# Patient Record
Sex: Female | Born: 1965 | Race: White | Hispanic: No | Smoking: Current every day smoker
Health system: Southern US, Community
[De-identification: ages and names within clinical notes are randomized; demographics above are authoritative.]

## PROBLEM LIST (undated history)

## (undated) DIAGNOSIS — I10 Essential (primary) hypertension: Secondary | ICD-10-CM

## (undated) DIAGNOSIS — Z720 Tobacco use: Secondary | ICD-10-CM

## (undated) DIAGNOSIS — K219 Gastro-esophageal reflux disease without esophagitis: Secondary | ICD-10-CM

## (undated) DIAGNOSIS — Z9289 Personal history of other medical treatment: Secondary | ICD-10-CM

## (undated) DIAGNOSIS — F419 Anxiety disorder, unspecified: Secondary | ICD-10-CM

## (undated) DIAGNOSIS — O2230 Deep phlebothrombosis in pregnancy, unspecified trimester: Secondary | ICD-10-CM

## (undated) DIAGNOSIS — I2699 Other pulmonary embolism without acute cor pulmonale: Secondary | ICD-10-CM

## (undated) DIAGNOSIS — J42 Unspecified chronic bronchitis: Secondary | ICD-10-CM

## (undated) DIAGNOSIS — F329 Major depressive disorder, single episode, unspecified: Secondary | ICD-10-CM

## (undated) DIAGNOSIS — F32A Depression, unspecified: Secondary | ICD-10-CM

## (undated) DIAGNOSIS — I803 Phlebitis and thrombophlebitis of lower extremities, unspecified: Secondary | ICD-10-CM

## (undated) DIAGNOSIS — G43909 Migraine, unspecified, not intractable, without status migrainosus: Secondary | ICD-10-CM

## (undated) HISTORY — PX: DILATION AND CURETTAGE OF UTERUS: SHX78

## (undated) HISTORY — PX: TUBAL LIGATION: SHX77

## (undated) HISTORY — PX: HERNIA REPAIR: SHX51

## (undated) HISTORY — PX: LAPAROSCOPIC GASTRIC SLEEVE RESECTION: SHX5895

## (undated) HISTORY — PX: LAPAROSCOPIC CHOLECYSTECTOMY: SUR755

## (undated) HISTORY — PX: AUGMENTATION MAMMAPLASTY: SUR837

## (undated) HISTORY — PX: UMBILICAL HERNIA REPAIR: SHX196

---

## 1990-01-03 DIAGNOSIS — I803 Phlebitis and thrombophlebitis of lower extremities, unspecified: Secondary | ICD-10-CM

## 1990-01-03 HISTORY — DX: Phlebitis and thrombophlebitis of lower extremities, unspecified: I80.3

## 2000-03-07 ENCOUNTER — Emergency Department (HOSPITAL_COMMUNITY): Admission: EM | Admit: 2000-03-07 | Discharge: 2000-03-08 | Payer: Self-pay | Admitting: Emergency Medicine

## 2009-07-27 ENCOUNTER — Emergency Department (HOSPITAL_COMMUNITY): Admission: EM | Admit: 2009-07-27 | Discharge: 2009-07-27 | Payer: Self-pay | Admitting: Emergency Medicine

## 2009-07-29 ENCOUNTER — Observation Stay (HOSPITAL_COMMUNITY): Admission: EM | Admit: 2009-07-29 | Discharge: 2009-07-31 | Payer: Self-pay | Admitting: Emergency Medicine

## 2009-07-30 ENCOUNTER — Encounter (INDEPENDENT_AMBULATORY_CARE_PROVIDER_SITE_OTHER): Payer: Self-pay

## 2010-03-20 LAB — POCT I-STAT, CHEM 8
BUN: 12 mg/dL (ref 6–23)
BUN: 13 mg/dL (ref 6–23)
Calcium, Ion: 1.09 mmol/L — ABNORMAL LOW (ref 1.12–1.32)
Calcium, Ion: 1.1 mmol/L — ABNORMAL LOW (ref 1.12–1.32)
Chloride: 101 mEq/L (ref 96–112)
Chloride: 102 mEq/L (ref 96–112)
Creatinine, Ser: 0.8 mg/dL (ref 0.4–1.2)
Glucose, Bld: 100 mg/dL — ABNORMAL HIGH (ref 70–99)
HCT: 43 % (ref 36.0–46.0)
HCT: 43 % (ref 36.0–46.0)
Hemoglobin: 14.6 g/dL (ref 12.0–15.0)
Hemoglobin: 14.6 g/dL (ref 12.0–15.0)
Potassium: 3.7 mEq/L (ref 3.5–5.1)
Potassium: 3.8 mEq/L (ref 3.5–5.1)
TCO2: 27 mmol/L (ref 0–100)
TCO2: 28 mmol/L (ref 0–100)

## 2010-03-20 LAB — CBC
HCT: 38.8 % (ref 36.0–46.0)
Hemoglobin: 13.6 g/dL (ref 12.0–15.0)
MCHC: 35 g/dL (ref 30.0–36.0)
Platelets: 217 10*3/uL (ref 150–400)
RBC: 4.22 MIL/uL (ref 3.87–5.11)
RDW: 13.9 % (ref 11.5–15.5)
RDW: 14.1 % (ref 11.5–15.5)
WBC: 7.4 10*3/uL (ref 4.0–10.5)

## 2010-03-20 LAB — URINALYSIS, ROUTINE W REFLEX MICROSCOPIC
Bilirubin Urine: NEGATIVE
Glucose, UA: NEGATIVE mg/dL
Glucose, UA: NEGATIVE mg/dL
Ketones, ur: NEGATIVE mg/dL
Protein, ur: NEGATIVE mg/dL
Urobilinogen, UA: 0.2 mg/dL (ref 0.0–1.0)

## 2010-03-20 LAB — DIFFERENTIAL
Basophils Absolute: 0 10*3/uL (ref 0.0–0.1)
Basophils Absolute: 0 10*3/uL (ref 0.0–0.1)
Basophils Relative: 0 % (ref 0–1)
Eosinophils Absolute: 0.1 10*3/uL (ref 0.0–0.7)
Eosinophils Absolute: 0.1 10*3/uL (ref 0.0–0.7)
Eosinophils Relative: 1 % (ref 0–5)
Lymphocytes Relative: 32 % (ref 12–46)
Lymphs Abs: 2.3 10*3/uL (ref 0.7–4.0)
Monocytes Absolute: 0.5 10*3/uL (ref 0.1–1.0)
Neutro Abs: 4.4 10*3/uL (ref 1.7–7.7)
Neutrophils Relative %: 60 % (ref 43–77)

## 2010-03-20 LAB — COMPREHENSIVE METABOLIC PANEL
Albumin: 3.9 g/dL (ref 3.5–5.2)
CO2: 30 mEq/L (ref 19–32)
Creatinine, Ser: 1.24 mg/dL — ABNORMAL HIGH (ref 0.4–1.2)
GFR calc non Af Amer: 47 mL/min — ABNORMAL LOW (ref >60)
Glucose, Bld: 91 mg/dL (ref 70–99)
Potassium: 3.4 mEq/L — ABNORMAL LOW (ref 3.5–5.1)
Sodium: 139 mEq/L (ref 135–145)
Total Bilirubin: 0.6 mg/dL (ref 0.3–1.2)

## 2010-03-20 LAB — HEPATIC FUNCTION PANEL
ALT: 18 U/L (ref 0–35)
Albumin: 3.6 g/dL (ref 3.5–5.2)
Alkaline Phosphatase: 52 U/L (ref 39–117)
Indirect Bilirubin: 0.4 mg/dL (ref 0.3–0.9)

## 2010-03-20 LAB — PREGNANCY, URINE: Preg Test, Ur: NEGATIVE

## 2010-03-20 LAB — LIPASE, BLOOD: Lipase: 41 U/L (ref 11–59)

## 2014-07-04 DIAGNOSIS — O2230 Deep phlebothrombosis in pregnancy, unspecified trimester: Secondary | ICD-10-CM

## 2014-07-04 DIAGNOSIS — I2699 Other pulmonary embolism without acute cor pulmonale: Secondary | ICD-10-CM

## 2014-07-04 DIAGNOSIS — Z9289 Personal history of other medical treatment: Secondary | ICD-10-CM

## 2014-07-04 HISTORY — DX: Deep phlebothrombosis in pregnancy, unspecified trimester: O22.30

## 2014-07-04 HISTORY — DX: Other pulmonary embolism without acute cor pulmonale: I26.99

## 2014-07-04 HISTORY — DX: Personal history of other medical treatment: Z92.89

## 2014-07-04 HISTORY — PX: VEIN SURGERY: SHX48

## 2014-07-17 ENCOUNTER — Other Ambulatory Visit: Payer: Self-pay | Admitting: Radiology

## 2014-07-17 DIAGNOSIS — I82401 Acute embolism and thrombosis of unspecified deep veins of right lower extremity: Secondary | ICD-10-CM

## 2014-07-21 ENCOUNTER — Other Ambulatory Visit (HOSPITAL_COMMUNITY): Payer: Self-pay | Admitting: Interventional Radiology

## 2014-07-21 DIAGNOSIS — I82401 Acute embolism and thrombosis of unspecified deep veins of right lower extremity: Secondary | ICD-10-CM

## 2014-07-28 ENCOUNTER — Other Ambulatory Visit (HOSPITAL_COMMUNITY): Payer: Self-pay | Admitting: Interventional Radiology

## 2014-07-28 DIAGNOSIS — I82401 Acute embolism and thrombosis of unspecified deep veins of right lower extremity: Secondary | ICD-10-CM

## 2014-07-29 ENCOUNTER — Other Ambulatory Visit: Payer: Self-pay

## 2014-08-07 ENCOUNTER — Other Ambulatory Visit: Payer: Self-pay

## 2014-09-03 ENCOUNTER — Other Ambulatory Visit: Payer: Self-pay

## 2014-12-13 ENCOUNTER — Encounter (HOSPITAL_BASED_OUTPATIENT_CLINIC_OR_DEPARTMENT_OTHER): Payer: Self-pay | Admitting: *Deleted

## 2014-12-13 ENCOUNTER — Emergency Department (HOSPITAL_BASED_OUTPATIENT_CLINIC_OR_DEPARTMENT_OTHER)
Admission: EM | Admit: 2014-12-13 | Discharge: 2014-12-13 | Disposition: A | Discharge status: Home / Self Care | Payer: 59 | Attending: Emergency Medicine | Admitting: Emergency Medicine

## 2014-12-13 ENCOUNTER — Emergency Department (HOSPITAL_BASED_OUTPATIENT_CLINIC_OR_DEPARTMENT_OTHER): Payer: 59

## 2014-12-13 DIAGNOSIS — I10 Essential (primary) hypertension: Secondary | ICD-10-CM | POA: Diagnosis not present

## 2014-12-13 DIAGNOSIS — S6992XA Unspecified injury of left wrist, hand and finger(s), initial encounter: Secondary | ICD-10-CM | POA: Diagnosis present

## 2014-12-13 DIAGNOSIS — S60212A Contusion of left wrist, initial encounter: Secondary | ICD-10-CM | POA: Diagnosis not present

## 2014-12-13 DIAGNOSIS — Y9389 Activity, other specified: Secondary | ICD-10-CM | POA: Insufficient documentation

## 2014-12-13 DIAGNOSIS — S8001XA Contusion of right knee, initial encounter: Secondary | ICD-10-CM | POA: Diagnosis not present

## 2014-12-13 DIAGNOSIS — Y998 Other external cause status: Secondary | ICD-10-CM | POA: Diagnosis not present

## 2014-12-13 DIAGNOSIS — Z86718 Personal history of other venous thrombosis and embolism: Secondary | ICD-10-CM | POA: Insufficient documentation

## 2014-12-13 DIAGNOSIS — Z86711 Personal history of pulmonary embolism: Secondary | ICD-10-CM | POA: Insufficient documentation

## 2014-12-13 DIAGNOSIS — W108XXA Fall (on) (from) other stairs and steps, initial encounter: Secondary | ICD-10-CM | POA: Insufficient documentation

## 2014-12-13 DIAGNOSIS — Y9289 Other specified places as the place of occurrence of the external cause: Secondary | ICD-10-CM | POA: Diagnosis not present

## 2014-12-13 DIAGNOSIS — Z7901 Long term (current) use of anticoagulants: Secondary | ICD-10-CM | POA: Diagnosis not present

## 2014-12-13 DIAGNOSIS — Z791 Long term (current) use of non-steroidal anti-inflammatories (NSAID): Secondary | ICD-10-CM | POA: Insufficient documentation

## 2014-12-13 DIAGNOSIS — F17210 Nicotine dependence, cigarettes, uncomplicated: Secondary | ICD-10-CM | POA: Diagnosis not present

## 2014-12-13 DIAGNOSIS — S60812A Abrasion of left wrist, initial encounter: Secondary | ICD-10-CM | POA: Diagnosis not present

## 2014-12-13 HISTORY — DX: Essential (primary) hypertension: I10

## 2014-12-13 HISTORY — DX: Other pulmonary embolism without acute cor pulmonale: I26.99

## 2014-12-13 HISTORY — DX: Deep phlebothrombosis in pregnancy, unspecified trimester: O22.30

## 2014-12-13 MED ORDER — HYDROCODONE-ACETAMINOPHEN 5-325 MG PO TABS
2.0000 | ORAL_TABLET | Freq: Once | ORAL | Status: AC
  Administered 2014-12-13: 2 via ORAL
  Filled 2014-12-13: qty 2

## 2014-12-13 MED ORDER — HYDROCODONE-ACETAMINOPHEN 5-325 MG PO TABS: 1.0000 | ORAL_TABLET | Freq: Four times a day (QID) | ORAL | Status: AC | PRN

## 2014-12-13 NOTE — ED Notes (Deleted)
Pt reports flank pain since 1600. States she thought she had a UTI earlier in the week but sx had resolved until today

## 2014-12-13 NOTE — ED Notes (Signed)
Pt reports she fell down 3 steps this morning- c/o pain in left wrist and right knee- states pain is worse in wrist and has been walking without difficulty

## 2014-12-13 NOTE — ED Notes (Signed)
Patient stable and ambulatory.  Patient verbalizes understanding of discharge medications, instructions and follow-up. 

## 2014-12-13 NOTE — Discharge Instructions (Signed)
1. Medications: vicodin, alternate naprosyn and tylenol for pain control, usual home medications 2. Treatment: rest, ice, elevate and use brace, drink plenty of fluids, gentle stretching 3. Follow Up: Please followup with orthopedics as directed or your PCP in 1 week if no improvement for discussion of your diagnoses and further evaluation after today's visit; if you do not have a primary care doctor use the resource guide provided to find one; Please return to the ER for worsening symptoms or other concerns    Contusion A contusion is a deep bruise. Contusions are the result of a blunt injury to tissues and muscle fibers under the skin. The injury causes bleeding under the skin. The skin overlying the contusion may turn blue, purple, or yellow. Minor injuries will give you a painless contusion, but more severe contusions may stay painful and swollen for a few weeks.  CAUSES  This condition is usually caused by a blow, trauma, or direct force to an area of the body. SYMPTOMS  Symptoms of this condition include:  Swelling of the injured area.  Pain and tenderness in the injured area.  Discoloration. The area may have redness and then turn blue, purple, or yellow. DIAGNOSIS  This condition is diagnosed based on a physical exam and medical history. An X-ray, CT scan, or MRI may be needed to determine if there are any associated injuries, such as broken bones (fractures). TREATMENT  Specific treatment for this condition depends on what area of the body was injured. In general, the best treatment for a contusion is resting, icing, applying pressure to (compression), and elevating the injured area. This is often called the RICE strategy. Over-the-counter anti-inflammatory medicines may also be recommended for pain control.  HOME CARE INSTRUCTIONS   Rest the injured area.  If directed, apply ice to the injured area:  Put ice in a plastic bag.  Place a towel between your skin and the  bag.  Leave the ice on for 20 minutes, 2-3 times per day.  If directed, apply light compression to the injured area using an elastic bandage. Make sure the bandage is not wrapped too tightly. Remove and reapply the bandage as directed by your health care provider.  If possible, raise (elevate) the injured area above the level of your heart while you are sitting or lying down.  Take over-the-counter and prescription medicines only as told by your health care provider. SEEK MEDICAL CARE IF:  Your symptoms do not improve after several days of treatment.  Your symptoms get worse.  You have difficulty moving the injured area. SEEK IMMEDIATE MEDICAL CARE IF:   You have severe pain.  You have numbness in a hand or foot.  Your hand or foot turns pale or cold.   This information is not intended to replace advice given to you by your health care provider. Make sure you discuss any questions you have with your health care provider.   Document Released: 09/29/2004 Document Revised: 09/10/2014 Document Reviewed: 05/07/2014 Elsevier Interactive Patient Education 2016 [place].    Cryotherapy Cryotherapy means treatment with cold. Ice or gel packs can be used to reduce both pain and swelling. Ice is the most helpful within the first 24 to 48 hours after an injury or flare-up from overusing a muscle or joint. Sprains, strains, spasms, burning pain, shooting pain, and aches can all be eased with ice. Ice can also be used when recovering from surgery. Ice is effective, has very few side effects, and is safe for  most people to use. PRECAUTIONS  Ice is not a safe treatment option for people with:  Raynaud phenomenon. This is a condition affecting small blood vessels in the extremities. Exposure to cold may cause your problems to return.  Cold hypersensitivity. There are many forms of cold hypersensitivity, including:  Cold urticaria. Red, itchy hives appear on the skin when the tissues begin  to warm after being iced.  Cold erythema. This is a red, itchy rash caused by exposure to cold.  Cold hemoglobinuria. Red blood cells break down when the tissues begin to warm after being iced. The hemoglobin that carry oxygen are passed into the urine because they cannot combine with blood proteins fast enough.  Numbness or altered sensitivity in the area being iced. If you have any of the following conditions, do not use ice until you have discussed cryotherapy with your caregiver:  Heart conditions, such as arrhythmia, angina, or chronic heart disease.  High blood pressure.  Healing wounds or open skin in the area being iced.  Current infections.  Rheumatoid arthritis.  Poor circulation.  Diabetes. Ice slows the blood flow in the region it is applied. This is beneficial when trying to stop inflamed tissues from spreading irritating chemicals to surrounding tissues. However, if you expose your skin to cold temperatures for too long or without the proper protection, you can damage your skin or nerves. Watch for signs of skin damage due to cold. HOME CARE INSTRUCTIONS Follow these tips to use ice and cold packs safely.  Place a dry or damp towel between the ice and skin. A damp towel will cool the skin more quickly, so you may need to shorten the time that the ice is used.  For a more rapid response, add gentle compression to the ice.  Ice for no more than 10 to 20 minutes at a time. The bonier the area you are icing, the less time it will take to get the benefits of ice.  Check your skin after 5 minutes to make sure there are no signs of a poor response to cold or skin damage.  Rest 20 minutes or more between uses.  Once your skin is numb, you can end your treatment. You can test numbness by very lightly touching your skin. The touch should be so light that you do not see the skin dimple from the pressure of your fingertip. When using ice, most people will feel these normal  sensations in this order: cold, burning, aching, and numbness.  Do not use ice on someone who cannot communicate their responses to pain, such as small children or people with dementia. HOW TO MAKE AN ICE PACK Ice packs are the most common way to use ice therapy. Other methods include ice massage, ice baths, and cryosprays. Muscle creams that cause a cold, tingly feeling do not offer the same benefits that ice offers and should not be used as a substitute unless recommended by your caregiver. To make an ice pack, do one of the following:  Place crushed ice or a bag of frozen vegetables in a sealable plastic bag. Squeeze out the excess air. Place this bag inside another plastic bag. Slide the bag into a pillowcase or place a damp towel between your skin and the bag.  Mix 3 parts water with 1 part rubbing alcohol. Freeze the mixture in a sealable plastic bag. When you remove the mixture from the freezer, it will be slushy. Squeeze out the excess air. Place this bag inside  another plastic bag. Slide the bag into a pillowcase or place a damp towel between your skin and the bag. SEEK MEDICAL CARE IF:  You develop white spots on your skin. This may give the skin a blotchy (mottled) appearance.  Your skin turns blue or pale.  Your skin becomes waxy or hard.  Your swelling gets worse. MAKE SURE YOU:   Understand these instructions.  Will watch your condition.  Will get help right away if you are not doing well or get worse.   This information is not intended to replace advice given to you by your health care provider. Make sure you discuss any questions you have with your health care provider.   Document Released: 08/16/2010 Document Revised: 01/10/2014 Document Reviewed: 08/16/2010 Elsevier Interactive Patient Education [employer].

## 2014-12-13 NOTE — ED Provider Notes (Signed)
CSN: [id number]     Arrival date & time 12/13/14  1744 History   First MD Initiated Contact with Patient 12/13/14 1817     Chief Complaint  Patient presents with  . Wrist Pain     (Consider location/radiation/quality/duration/timing/severity/associated sxs/prior Treatment) The history is provided by the patient and medical records. No language interpreter was used.   Sonya Arnold is a 49 y.o. 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 Patient is adamant that she did not hit her head or have a loss of consciousness. She reports her pain is localized to her left wrist and right knee.  Patient reports she has been ambulatory without difficulty. She denies numbness, tingling, weakness. She denies neck or back pain. She has associated swelling and ecchymosis to the left wrist without laceration. She has taken ibuprofen without improvement. Palpation and movement makes it worse.  Past Medical History  Diagnosis Date  . DVT (deep vein thrombosis) in pregnancy   . PE (pulmonary embolism)   . Hypertension    Past Surgical History  Procedure Laterality Date  . Cholecystectomy    . Tubal ligation    . Vein surgery     No family history on file. Social History  Substance Use Topics  . Smoking status: Current Every Day Smoker    Types: Cigarettes  . Smokeless tobacco: Never Used  . Alcohol Use: No   OB History    No data available     Review of Systems  Constitutional: Negative for fever and chills.  Gastrointestinal: Negative for nausea and vomiting.  Musculoskeletal: Positive for joint swelling and arthralgias. Negative for back pain, neck pain and neck stiffness.  Skin: Negative for wound.  Neurological: Negative for numbness.  Hematological: Does not bruise/bleed  easily.  Psychiatric/Behavioral: The patient is not nervous/anxious.   All other systems reviewed and are negative.     Allergies  Cataflam and Norvasc  Home Medications   Prior to Admission medications   Medication Sig Start Date End Date Taking? Authorizing Provider  meloxicam (MOBIC) 7.5 MG tablet Take 7.5 mg by mouth daily.   Yes Historical Provider, MD  RANITIDINE HCL PO Take by mouth.   Yes Historical Provider, MD  Rivaroxaban (XARELTO) 15 MG TABS tablet Take 15 mg by mouth daily with supper.   Yes Historical Provider, MD  HYDROcodone-acetaminophen (NORCO/VICODIN) 5-325 MG tablet Take 1-2 tablets by mouth every 6 (six) hours as needed for moderate pain or severe pain. 12/13/14   Crysta Gulick, PA-C   BP 154/89 mmHg  Pulse 84  Temp(Src) 98.1 F (36.7 C) (Oral)  Resp 20  Ht 5\' 2"  (1.575 m)  Wt 81.647 kg  BMI 32.91 kg/m2  SpO2 99%  LMP 11/29/2014 (Exact Date) Physical Exam  Constitutional: She appears well-developed and well-nourished. No distress.  HENT:  Head: Normocephalic and atraumatic.  Eyes: Conjunctivae are normal.  Neck: Normal range of motion.  Cardiovascular: Normal rate, regular rhythm and intact distal pulses.   Capillary refill < 3 sec  Pulmonary/Chest: Effort normal and breath sounds normal.  Musculoskeletal: She exhibits tenderness. She exhibits no edema.  ROM: Full range of motion of the left shoulder, left elbow and all fingers of the left hand including the thumb Slightly decreased range of motion of the left wrist Contusion, abrasion and ecchymosis noted  to the lateral portion of the left wrist extending onto the dorsum of the hand with tenderness to palpation in this area No palpable deformity No laceration Full range of motion of the right knee with small area of ecchymosis just inferior to the patella Abnormal patellar movement, no joint line tenderness of the right knee  Neurological: She is alert. Coordination normal.  Sensation intact  to dull and sharp Strength 5/5 in the left hand including strong grip strength; 4/5 in the left wrist due to pain  Skin: Skin is warm and dry. She is not diaphoretic.  No tenting of the skin  Psychiatric: She has a normal mood and affect.  Nursing note and vitals reviewed.   ED Course  Procedures (including critical care time)  Imaging Review Dg Wrist Complete Left  12/13/2014  CLINICAL DATA:  Fall down stairs with left wrist pain, initial encounter EXAM: LEFT WRIST - COMPLETE 3+ VIEW COMPARISON:  None. FINDINGS: There is no evidence of fracture or dislocation. There is no evidence of arthropathy or other focal bone abnormality. Soft tissues are unremarkable. IMPRESSION: No acute abnormality noted. Electronically Signed   By: Inez Catalina M.D.   On: 12/13/2014 18:51   Dg Hand Complete Left  12/13/2014  CLINICAL DATA:  Acute left hand pain after fall down steps this morning. Initial encounter. EXAM: LEFT HAND - COMPLETE 3+ VIEW COMPARISON:  None. FINDINGS: There is no evidence of fracture or dislocation. There is no evidence of arthropathy or other focal bone abnormality. Soft tissues are unremarkable. IMPRESSION: Normal left hand. Electronically Signed   By: Marijo Conception, M.D.   On: 12/13/2014 18:52   I have personally reviewed and evaluated these images and lab results as part of my medical decision-making.    MDM   Final diagnoses:  Left wrist injury, initial encounter  Wrist contusion, left, initial encounter  Knee contusion, right, initial encounter   Sonya Arnold presents with left wrist pain, ecchymosis and swelling.  Patient X-Ray negative for obvious fracture or dislocation. Pain managed in ED. Pt advised to follow up with [facility] care and for orthopedics for further evaluation if symptoms persist. Patient given brace while in ED, conservative therapy recommended and discussed. Patient will be dc home & is agreeable with above plan.   Abigail Butts, PA-C 12/13/14  [place], MD 12/14/14 [phone]

## 2014-12-31 ENCOUNTER — Emergency Department (HOSPITAL_BASED_OUTPATIENT_CLINIC_OR_DEPARTMENT_OTHER): Payer: 59

## 2014-12-31 ENCOUNTER — Inpatient Hospital Stay (HOSPITAL_BASED_OUTPATIENT_CLINIC_OR_DEPARTMENT_OTHER)
Admission: EM | Admit: 2014-12-31 | Discharge: 2015-01-03 | DRG: 203 | Disposition: A | Discharge status: Home / Self Care | Payer: 59 | Attending: Internal Medicine | Admitting: Internal Medicine

## 2014-12-31 ENCOUNTER — Encounter (HOSPITAL_BASED_OUTPATIENT_CLINIC_OR_DEPARTMENT_OTHER): Payer: Self-pay | Admitting: *Deleted

## 2014-12-31 DIAGNOSIS — Z720 Tobacco use: Secondary | ICD-10-CM | POA: Diagnosis present

## 2014-12-31 DIAGNOSIS — I10 Essential (primary) hypertension: Secondary | ICD-10-CM | POA: Diagnosis present

## 2014-12-31 DIAGNOSIS — N920 Excessive and frequent menstruation with regular cycle: Secondary | ICD-10-CM | POA: Diagnosis present

## 2014-12-31 DIAGNOSIS — D500 Iron deficiency anemia secondary to blood loss (chronic): Secondary | ICD-10-CM | POA: Diagnosis present

## 2014-12-31 DIAGNOSIS — T380X5A Adverse effect of glucocorticoids and synthetic analogues, initial encounter: Secondary | ICD-10-CM | POA: Diagnosis not present

## 2014-12-31 DIAGNOSIS — Z791 Long term (current) use of non-steroidal anti-inflammatories (NSAID): Secondary | ICD-10-CM | POA: Diagnosis not present

## 2014-12-31 DIAGNOSIS — O2230 Deep phlebothrombosis in pregnancy, unspecified trimester: Secondary | ICD-10-CM

## 2014-12-31 DIAGNOSIS — R05 Cough: Secondary | ICD-10-CM | POA: Diagnosis present

## 2014-12-31 DIAGNOSIS — J40 Bronchitis, not specified as acute or chronic: Secondary | ICD-10-CM | POA: Diagnosis present

## 2014-12-31 DIAGNOSIS — Z7902 Long term (current) use of antithrombotics/antiplatelets: Secondary | ICD-10-CM

## 2014-12-31 DIAGNOSIS — J209 Acute bronchitis, unspecified: Secondary | ICD-10-CM | POA: Diagnosis present

## 2014-12-31 DIAGNOSIS — G43909 Migraine, unspecified, not intractable, without status migrainosus: Secondary | ICD-10-CM | POA: Diagnosis present

## 2014-12-31 DIAGNOSIS — Z716 Tobacco abuse counseling: Secondary | ICD-10-CM

## 2014-12-31 DIAGNOSIS — K219 Gastro-esophageal reflux disease without esophagitis: Secondary | ICD-10-CM | POA: Diagnosis present

## 2014-12-31 DIAGNOSIS — D72828 Other elevated white blood cell count: Secondary | ICD-10-CM | POA: Diagnosis not present

## 2014-12-31 DIAGNOSIS — D509 Iron deficiency anemia, unspecified: Secondary | ICD-10-CM | POA: Diagnosis present

## 2014-12-31 DIAGNOSIS — Z86718 Personal history of other venous thrombosis and embolism: Secondary | ICD-10-CM | POA: Diagnosis not present

## 2014-12-31 DIAGNOSIS — F329 Major depressive disorder, single episode, unspecified: Secondary | ICD-10-CM | POA: Diagnosis present

## 2014-12-31 DIAGNOSIS — F419 Anxiety disorder, unspecified: Secondary | ICD-10-CM | POA: Diagnosis present

## 2014-12-31 DIAGNOSIS — Y92239 Unspecified place in hospital as the place of occurrence of the external cause: Secondary | ICD-10-CM | POA: Diagnosis not present

## 2014-12-31 DIAGNOSIS — Z9049 Acquired absence of other specified parts of digestive tract: Secondary | ICD-10-CM

## 2014-12-31 DIAGNOSIS — Z86711 Personal history of pulmonary embolism: Secondary | ICD-10-CM

## 2014-12-31 DIAGNOSIS — I82409 Acute embolism and thrombosis of unspecified deep veins of unspecified lower extremity: Secondary | ICD-10-CM | POA: Diagnosis present

## 2014-12-31 DIAGNOSIS — D251 Intramural leiomyoma of uterus: Secondary | ICD-10-CM | POA: Diagnosis present

## 2014-12-31 DIAGNOSIS — F17210 Nicotine dependence, cigarettes, uncomplicated: Secondary | ICD-10-CM | POA: Diagnosis present

## 2014-12-31 DIAGNOSIS — D649 Anemia, unspecified: Secondary | ICD-10-CM

## 2014-12-31 DIAGNOSIS — J42 Unspecified chronic bronchitis: Secondary | ICD-10-CM | POA: Diagnosis present

## 2014-12-31 DIAGNOSIS — R059 Cough, unspecified: Secondary | ICD-10-CM | POA: Diagnosis present

## 2014-12-31 DIAGNOSIS — Z8249 Family history of ischemic heart disease and other diseases of the circulatory system: Secondary | ICD-10-CM

## 2014-12-31 DIAGNOSIS — I2699 Other pulmonary embolism without acute cor pulmonale: Secondary | ICD-10-CM | POA: Diagnosis not present

## 2014-12-31 DIAGNOSIS — N83292 Other ovarian cyst, left side: Secondary | ICD-10-CM | POA: Diagnosis present

## 2014-12-31 HISTORY — DX: Migraine, unspecified, not intractable, without status migrainosus: G43.909

## 2014-12-31 HISTORY — DX: Depression, unspecified: F32.A

## 2014-12-31 HISTORY — DX: Unspecified chronic bronchitis: J42

## 2014-12-31 HISTORY — DX: Gastro-esophageal reflux disease without esophagitis: K21.9

## 2014-12-31 HISTORY — DX: Personal history of other medical treatment: Z92.89

## 2014-12-31 HISTORY — DX: Major depressive disorder, single episode, unspecified: F32.9

## 2014-12-31 HISTORY — DX: Phlebitis and thrombophlebitis of lower extremities, unspecified: I80.3

## 2014-12-31 HISTORY — DX: Tobacco use: Z72.0

## 2014-12-31 HISTORY — DX: Anxiety disorder, unspecified: F41.9

## 2014-12-31 LAB — PROTIME-INR
INR: 1.43 (ref 0.00–1.49)
PROTHROMBIN TIME: 17.5 s — AB (ref 11.6–15.2)

## 2014-12-31 LAB — CBC WITH DIFFERENTIAL/PLATELET
BASOS ABS: 0 10*3/uL (ref 0.0–0.1)
Basophils Relative: 1 %
Eosinophils Absolute: 0.1 10*3/uL (ref 0.0–0.7)
Eosinophils Relative: 2 %
HEMATOCRIT: 27.4 % — AB (ref 36.0–46.0)
HEMOGLOBIN: 7.7 g/dL — AB (ref 12.0–15.0)
LYMPHS PCT: 18 %
Lymphs Abs: 1.3 10*3/uL (ref 0.7–4.0)
MCH: 19.4 pg — ABNORMAL LOW (ref 26.0–34.0)
MCHC: 28.1 g/dL — AB (ref 30.0–36.0)
MCV: 69.2 fL — ABNORMAL LOW (ref 78.0–100.0)
MONO ABS: 0.7 10*3/uL (ref 0.1–1.0)
Monocytes Relative: 10 %
NEUTROS ABS: 5.3 10*3/uL (ref 1.7–7.7)
Neutrophils Relative %: 71 %
Platelets: 272 10*3/uL (ref 150–400)
RBC: 3.96 MIL/uL (ref 3.87–5.11)
RDW: 18 % — ABNORMAL HIGH (ref 11.5–15.5)
WBC: 7.5 10*3/uL (ref 4.0–10.5)

## 2014-12-31 LAB — BASIC METABOLIC PANEL
ANION GAP: 5 (ref 5–15)
BUN: 12 mg/dL (ref 6–20)
CHLORIDE: 108 mmol/L (ref 101–111)
CO2: 26 mmol/L (ref 22–32)
Calcium: 8.4 mg/dL — ABNORMAL LOW (ref 8.9–10.3)
Creatinine, Ser: 0.82 mg/dL (ref 0.44–1.00)
GFR calc Af Amer: >60 mL/min (ref >60)
GLUCOSE: 94 mg/dL (ref 65–99)
POTASSIUM: 3.8 mmol/L (ref 3.5–5.1)
Sodium: 139 mmol/L (ref 135–145)

## 2014-12-31 LAB — URINALYSIS, ROUTINE W REFLEX MICROSCOPIC
Glucose, UA: NEGATIVE mg/dL
Hgb urine dipstick: NEGATIVE
Ketones, ur: 15 mg/dL — AB
LEUKOCYTES UA: NEGATIVE
NITRITE: NEGATIVE
PH: 6 (ref 5.0–8.0)
Protein, ur: 30 mg/dL — AB
SPECIFIC GRAVITY, URINE: 1.027 (ref 1.005–1.030)

## 2014-12-31 LAB — URINE MICROSCOPIC-ADD ON: RBC / HPF: NONE SEEN RBC/hpf (ref 0–5)

## 2014-12-31 LAB — OCCULT BLOOD X 1 CARD TO LAB, STOOL: Fecal Occult Bld: NEGATIVE

## 2014-12-31 LAB — APTT: aPTT: 39 seconds — ABNORMAL HIGH (ref 24–37)

## 2014-12-31 MED ORDER — NICOTINE 21 MG/24HR TD PT24
21.0000 mg | MEDICATED_PATCH | Freq: Every day | TRANSDERMAL | Status: DC
  Administered 2015-01-01 – 2015-01-03 (×3): 21 mg via TRANSDERMAL
  Filled 2014-12-31 (×3): qty 1

## 2014-12-31 MED ORDER — IPRATROPIUM-ALBUTEROL 0.5-2.5 (3) MG/3ML IN SOLN
3.0000 mL | RESPIRATORY_TRACT | Status: DC
  Administered 2015-01-01: 3 mL via RESPIRATORY_TRACT
  Filled 2014-12-31: qty 3

## 2014-12-31 MED ORDER — DEXAMETHASONE SODIUM PHOSPHATE 10 MG/ML IJ SOLN
10.0000 mg | Freq: Once | INTRAMUSCULAR | Status: AC
  Administered 2014-12-31: 10 mg via INTRAVENOUS
  Filled 2014-12-31: qty 1

## 2014-12-31 MED ORDER — ONDANSETRON HCL 4 MG/2ML IJ SOLN
4.0000 mg | Freq: Four times a day (QID) | INTRAMUSCULAR | Status: DC | PRN
Start: 2014-12-31 — End: 2015-01-03

## 2014-12-31 MED ORDER — ONDANSETRON HCL 4 MG PO TABS: 4.0000 mg | ORAL_TABLET | Freq: Four times a day (QID) | ORAL | Status: DC | PRN

## 2014-12-31 MED ORDER — RIVAROXABAN 15 MG PO TABS: 15.0000 mg | ORAL_TABLET | Freq: Every day | ORAL | Status: DC

## 2014-12-31 MED ORDER — TRAZODONE HCL 100 MG PO TABS
100.0000 mg | ORAL_TABLET | Freq: Every day | ORAL | Status: DC
  Administered 2015-01-01 – 2015-01-02 (×3): 100 mg via ORAL
  Filled 2014-12-31 (×3): qty 1

## 2014-12-31 MED ORDER — METHYLPREDNISOLONE SODIUM SUCC 125 MG IJ SOLR
60.0000 mg | Freq: Every day | INTRAMUSCULAR | Status: DC
  Administered 2015-01-01 – 2015-01-02 (×2): 60 mg via INTRAVENOUS
  Filled 2014-12-31 (×2): qty 2

## 2014-12-31 MED ORDER — DIAZEPAM 5 MG PO TABS
10.0000 mg | ORAL_TABLET | Freq: Two times a day (BID) | ORAL | Status: DC | PRN
  Administered 2015-01-01 – 2015-01-03 (×5): 10 mg via ORAL
  Filled 2014-12-31 (×5): qty 2

## 2014-12-31 MED ORDER — HYDROCODONE-ACETAMINOPHEN 5-325 MG PO TABS
1.0000 | ORAL_TABLET | Freq: Four times a day (QID) | ORAL | Status: DC | PRN
  Administered 2015-01-01 – 2015-01-03 (×9): 2 via ORAL
  Filled 2014-12-31 (×9): qty 2

## 2014-12-31 MED ORDER — IPRATROPIUM-ALBUTEROL 0.5-2.5 (3) MG/3ML IN SOLN
3.0000 mL | Freq: Once | RESPIRATORY_TRACT | Status: AC
Start: 2014-12-31 — End: 2014-12-31
  Administered 2014-12-31: 3 mL via RESPIRATORY_TRACT
  Filled 2014-12-31: qty 3

## 2014-12-31 MED ORDER — ACETAMINOPHEN 325 MG PO TABS: 650.0000 mg | ORAL_TABLET | Freq: Four times a day (QID) | ORAL | Status: DC | PRN

## 2014-12-31 MED ORDER — SODIUM CHLORIDE 0.9 % IJ SOLN
3.0000 mL | Freq: Two times a day (BID) | INTRAMUSCULAR | Status: DC
  Administered 2015-01-01 – 2015-01-02 (×4): 3 mL via INTRAVENOUS

## 2014-12-31 MED ORDER — ALBUTEROL SULFATE (2.5 MG/3ML) 0.083% IN NEBU: 5.0000 mg | INHALATION_SOLUTION | RESPIRATORY_TRACT | Status: AC | PRN

## 2014-12-31 MED ORDER — ACETAMINOPHEN 650 MG RE SUPP: 650.0000 mg | Freq: Four times a day (QID) | RECTAL | Status: DC | PRN

## 2014-12-31 MED ORDER — AZITHROMYCIN 500 MG PO TABS
500.0000 mg | ORAL_TABLET | Freq: Every day | ORAL | Status: AC
  Administered 2015-01-01: 500 mg via ORAL
  Filled 2014-12-31: qty 1

## 2014-12-31 MED ORDER — DM-GUAIFENESIN ER 30-600 MG PO TB12
1.0000 | ORAL_TABLET | Freq: Two times a day (BID) | ORAL | Status: DC
  Administered 2015-01-01 – 2015-01-03 (×6): 1 via ORAL
  Filled 2014-12-31 (×6): qty 1

## 2014-12-31 MED ORDER — AZITHROMYCIN 500 MG PO TABS
250.0000 mg | ORAL_TABLET | Freq: Every day | ORAL | Status: DC
  Administered 2015-01-01 – 2015-01-03 (×3): 250 mg via ORAL
  Filled 2014-12-31 (×3): qty 1

## 2014-12-31 MED ORDER — ALBUTEROL SULFATE (2.5 MG/3ML) 0.083% IN NEBU: 5.0000 mg | INHALATION_SOLUTION | RESPIRATORY_TRACT | Status: DC | PRN

## 2014-12-31 MED ORDER — IPRATROPIUM-ALBUTEROL 0.5-2.5 (3) MG/3ML IN SOLN
3.0000 mL | Freq: Once | RESPIRATORY_TRACT | Status: AC
  Administered 2014-12-31: 3 mL via RESPIRATORY_TRACT
  Filled 2014-12-31: qty 3

## 2014-12-31 MED ORDER — MELOXICAM 7.5 MG PO TABS
7.5000 mg | ORAL_TABLET | Freq: Every day | ORAL | Status: DC
  Administered 2015-01-01 – 2015-01-03 (×3): 7.5 mg via ORAL
  Filled 2014-12-31 (×4): qty 1

## 2014-12-31 MED ORDER — FAMOTIDINE 20 MG PO TABS
10.0000 mg | ORAL_TABLET | Freq: Two times a day (BID) | ORAL | Status: DC
  Administered 2015-01-01 – 2015-01-03 (×6): 10 mg via ORAL
  Filled 2014-12-31 (×6): qty 1

## 2014-12-31 NOTE — ED Notes (Signed)
Pt amb to triage with quick steady gait in nad. Pt reports cough and congestion x Saturday.

## 2014-12-31 NOTE — Progress Notes (Signed)
Transfer from  [facility]:  49 year old lady with past medical history of hypertension, DVT, PE on Xarelto, who presents with cough, congestion and fever for 5 days. Patient has wheezing per EDP. Chest x-ray is negative for pneumonia.   Patient was found to have hemoglobin drop from 9.9 in July to 7.7 today per EDP. Patient had normal period. No hematochezia. Electrolytes and renal function okay, WBC 7.5, temperature normal in the emergency room, heart rate 97, RR 20, oxygen saturation 96%. Pt is accepted to tele bed. FOBT will be done by EDP.   Ivor Costa, MD  [facility] Pager [phone]  If 7PM-7AM, please contact night-coverage [email or web address] Password [facility] 12/31/2014, 8:03 PM

## 2014-12-31 NOTE — ED Provider Notes (Signed)
CSN: [id number]     Arrival date & time 12/31/14  1542 History   First MD Initiated Contact with Patient 12/31/14 1703     Chief Complaint  Patient presents with  . Cough     (Consider location/radiation/quality/duration/timing/severity/associated sxs/prior Treatment) The history is provided by the patient.     Pt with hx DVT, PE, on xarelto, chronic smoker p/w 5 days of dry cough, SOB, chest soreness, fevers to 102, myalgias, sore throat.  Father has been admitted for pneumonia.  Has been taking mucinex and OTC cough medication without improvement.    Has hx PE and DVT and is on xarelto.  Has been having dyspnea with exertion, worsening over some time but much worse since she became ill last week.  Does not follow with anyone to check blood counts.  Has not noticed any abnormal bleeding.  LMP last week, normal.    Past Medical History  Diagnosis Date  . DVT (deep vein thrombosis) in pregnancy   . PE (pulmonary embolism)   . Hypertension    Past Surgical History  Procedure Laterality Date  . Cholecystectomy    . Tubal ligation    . Vein surgery     History reviewed. No pertinent family history. Social History  Substance Use Topics  . Smoking status: Current Every Day Smoker    Types: Cigarettes  . Smokeless tobacco: Never Used  . Alcohol Use: No   OB History    No data available     Review of Systems  All other systems reviewed and are negative.     Allergies  Cataflam and Norvasc  Home Medications   Prior to Admission medications   Medication Sig Start Date End Date Taking? Authorizing Provider  diazepam (VALIUM) 5 MG tablet Take 10 mg by mouth every 12 (twelve) hours as needed for anxiety.   Yes Historical Provider, MD  traZODone (DESYREL) 100 MG tablet Take 100 mg by mouth at bedtime.   Yes Historical Provider, MD  HYDROcodone-acetaminophen (NORCO/VICODIN) 5-325 MG tablet Take 1-2 tablets by mouth every 6 (six) hours as needed for moderate pain or severe  pain. 12/13/14   Hannah Muthersbaugh, PA-C  meloxicam (MOBIC) 7.5 MG tablet Take 7.5 mg by mouth daily.    Historical Provider, MD  RANITIDINE HCL PO Take by mouth.    Historical Provider, MD  Rivaroxaban (XARELTO) 15 MG TABS tablet Take 15 mg by mouth daily with supper.    Historical Provider, MD   BP 119/71 mmHg  Pulse 93  Temp(Src) 98.8 F (37.1 C) (Oral)  Resp 20  Ht 5\' 2"  (1.575 m)  Wt 79.379 kg  BMI 32.00 kg/m2  SpO2 97%  LMP 12/29/2014 Physical Exam  Constitutional: She appears well-developed and well-nourished. No distress.  Uncomfortable appearing  HENT:  Head: Normocephalic and atraumatic.  Eyes: Conjunctivae are normal.  Neck: Normal range of motion. Neck supple.  Cardiovascular: Normal rate and regular rhythm.   Pulmonary/Chest: Effort normal. No respiratory distress. She has decreased breath sounds. She has wheezes. She has no rhonchi. She has rales.  Increased work of breathing  Musculoskeletal: She exhibits no edema.  Neurological: She is alert. She exhibits normal muscle tone.  Skin: She is not diaphoretic.  Psychiatric: She has a normal mood and affect. Her behavior is normal.  Nursing note and vitals reviewed.   ED Course  Procedures (including critical care time) Labs Review Labs Reviewed  BASIC METABOLIC PANEL - Abnormal; Notable for the following:    Calcium  8.4 (*)    All other components within normal limits  CBC WITH DIFFERENTIAL/PLATELET - Abnormal; Notable for the following:    Hemoglobin 7.7 (*)    HCT 27.4 (*)    MCV 69.2 (*)    MCH 19.4 (*)    MCHC 28.1 (*)    RDW 18.0 (*)    All other components within normal limits  URINALYSIS, ROUTINE W REFLEX MICROSCOPIC (NOT AT [facility]) - Abnormal; Notable for the following:    Bilirubin Urine SMALL (*)    Ketones, ur 15 (*)    Protein, ur 30 (*)    All other components within normal limits  PROTIME-INR - Abnormal; Notable for the following:    Prothrombin Time 17.5 (*)    All other components  within normal limits  APTT - Abnormal; Notable for the following:    aPTT 39 (*)    All other components within normal limits  URINE MICROSCOPIC-ADD ON - Abnormal; Notable for the following:    Squamous Epithelial / LPF 0-5 (*)    Bacteria, UA MANY (*)    All other components within normal limits  OCCULT BLOOD X 1 CARD TO LAB, STOOL  TYPE AND SCREEN    Imaging Review Dg Chest 2 View  12/31/2014  CLINICAL DATA:  Cough, fever, wheezing for 4 days EXAM: CHEST  2 VIEW COMPARISON:  10/14/2013 FINDINGS: The heart size and mediastinal contours are within normal limits. Both lungs are clear. The visualized skeletal structures are unremarkable. IMPRESSION: No active cardiopulmonary disease. Electronically Signed   By: Rolm Baptise M.D.   On: 12/31/2014 18:14   I have personally reviewed and evaluated these images and lab results as part of my medical decision-making.   EKG Interpretation None       After first breathing treatment pt continues to have loud diffuse wheezing and not moving air well.   No tele beds available at [facility].   8:03 PM I spoke with Dr Blaine Hamper who accepts patient for admission.     MDM   Final diagnoses:  Symptomatic anemia  Bronchitis with bronchospasm    Afebrile nontoxic patient with SOB, chest soreness, cough, fevers x 5 days with loud diffuse wheezing on exam and possible crackles.  CXR negative.  Labs significant for worsening anemia.  Last Hgb I have found on record was 9.9 in July 2016.  Pt does not have this monitored as an outpatient but has not noted bleeding.  Nebs improved wheezing temporarily, also given decadron.  Given patient's persistent wheezing and SOB, also with DOE likely due to symptomatic anemia, pt admitted to [facility], transferred to [place], PA-C 12/31/14 2121  Quintella Reichert, MD 01/01/15 0110

## 2015-01-01 ENCOUNTER — Inpatient Hospital Stay (HOSPITAL_COMMUNITY): Payer: 59

## 2015-01-01 ENCOUNTER — Encounter (HOSPITAL_COMMUNITY): Payer: Self-pay | Admitting: General Practice

## 2015-01-01 DIAGNOSIS — J40 Bronchitis, not specified as acute or chronic: Secondary | ICD-10-CM

## 2015-01-01 DIAGNOSIS — I2699 Other pulmonary embolism without acute cor pulmonale: Secondary | ICD-10-CM

## 2015-01-01 DIAGNOSIS — D509 Iron deficiency anemia, unspecified: Secondary | ICD-10-CM

## 2015-01-01 DIAGNOSIS — N920 Excessive and frequent menstruation with regular cycle: Secondary | ICD-10-CM

## 2015-01-01 DIAGNOSIS — K219 Gastro-esophageal reflux disease without esophagitis: Secondary | ICD-10-CM

## 2015-01-01 DIAGNOSIS — R05 Cough: Secondary | ICD-10-CM

## 2015-01-01 DIAGNOSIS — Z720 Tobacco use: Secondary | ICD-10-CM

## 2015-01-01 LAB — FERRITIN: Ferritin: 5 ng/mL — ABNORMAL LOW (ref 11–307)

## 2015-01-01 LAB — IRON AND TIBC
IRON: 7 ug/dL — AB (ref 28–170)
SATURATION RATIOS: 2 % — AB (ref 10.4–31.8)
TIBC: 407 ug/dL (ref 250–450)
UIBC: 400 ug/dL

## 2015-01-01 LAB — RETICULOCYTES
RBC.: 3.53 MIL/uL — ABNORMAL LOW (ref 3.87–5.11)
Retic Count, Absolute: 28.2 10*3/uL (ref 19.0–186.0)
Retic Ct Pct: 0.8 % (ref 0.4–3.1)

## 2015-01-01 LAB — CBC
HCT: 23.8 % — ABNORMAL LOW (ref 36.0–46.0)
HEMOGLOBIN: 6.9 g/dL — AB (ref 12.0–15.0)
MCH: 19.9 pg — AB (ref 26.0–34.0)
MCHC: 29 g/dL — ABNORMAL LOW (ref 30.0–36.0)
MCV: 68.8 fL — AB (ref 78.0–100.0)
PLATELETS: 268 10*3/uL (ref 150–400)
RBC: 3.46 MIL/uL — AB (ref 3.87–5.11)
RDW: 18 % — ABNORMAL HIGH (ref 11.5–15.5)
WBC: 5.3 10*3/uL (ref 4.0–10.5)

## 2015-01-01 LAB — BASIC METABOLIC PANEL
ANION GAP: 9 (ref 5–15)
BUN: 7 mg/dL (ref 6–20)
CHLORIDE: 105 mmol/L (ref 101–111)
CO2: 25 mmol/L (ref 22–32)
CREATININE: 0.66 mg/dL (ref 0.44–1.00)
Calcium: 8.8 mg/dL — ABNORMAL LOW (ref 8.9–10.3)
GFR calc Af Amer: >60 mL/min (ref >60)
GFR calc non Af Amer: >60 mL/min (ref >60)
Glucose, Bld: 187 mg/dL — ABNORMAL HIGH (ref 65–99)
Potassium: 3.6 mmol/L (ref 3.5–5.1)
Sodium: 139 mmol/L (ref 135–145)

## 2015-01-01 LAB — INFLUENZA PANEL BY PCR (TYPE A & B)
H1N1 flu by pcr: NOT DETECTED
Influenza A By PCR: NEGATIVE
Influenza B By PCR: NEGATIVE

## 2015-01-01 LAB — PREPARE RBC (CROSSMATCH)

## 2015-01-01 LAB — HEPATIC FUNCTION PANEL
ALT: 13 U/L — AB (ref 14–54)
AST: 19 U/L (ref 15–41)
Albumin: 3.3 g/dL — ABNORMAL LOW (ref 3.5–5.0)
Alkaline Phosphatase: 57 U/L (ref 38–126)
BILIRUBIN TOTAL: 0.4 mg/dL (ref 0.3–1.2)
Total Protein: 6.3 g/dL — ABNORMAL LOW (ref 6.5–8.1)

## 2015-01-01 LAB — FOLATE: FOLATE: 6.2 ng/mL (ref >5.9)

## 2015-01-01 LAB — VITAMIN B12: Vitamin B-12: 213 pg/mL (ref 180–914)

## 2015-01-01 LAB — ABO/RH: ABO/RH(D): B POS

## 2015-01-01 MED ORDER — MENTHOL 3 MG MT LOZG
1.0000 | LOZENGE | OROMUCOSAL | Status: DC | PRN
  Administered 2015-01-01 – 2015-01-03 (×2): 3 mg via ORAL
  Filled 2015-01-01 (×3): qty 9

## 2015-01-01 MED ORDER — SODIUM CHLORIDE 0.9 % IV SOLN: Freq: Once | INTRAVENOUS | Status: AC

## 2015-01-01 MED ORDER — RIVAROXABAN 20 MG PO TABS
20.0000 mg | ORAL_TABLET | Freq: Every day | ORAL | Status: DC
  Administered 2015-01-01 – 2015-01-02 (×2): 20 mg via ORAL
  Filled 2015-01-01 (×3): qty 1

## 2015-01-01 MED ORDER — IPRATROPIUM-ALBUTEROL 0.5-2.5 (3) MG/3ML IN SOLN
3.0000 mL | Freq: Three times a day (TID) | RESPIRATORY_TRACT | Status: DC
  Administered 2015-01-01 – 2015-01-03 (×7): 3 mL via RESPIRATORY_TRACT
  Filled 2015-01-01 (×7): qty 3

## 2015-01-01 MED ORDER — INFLUENZA VAC SPLIT QUAD 0.5 ML IM SUSY
0.5000 mL | PREFILLED_SYRINGE | Freq: Once | INTRAMUSCULAR | Status: AC
  Administered 2015-01-01: 0.5 mL via INTRAMUSCULAR
  Filled 2015-01-01: qty 0.5

## 2015-01-01 NOTE — Progress Notes (Signed)
[facility]                                                                              Patient Demographics  Sonya Arnold, is a 49 y.o. female, DOB - January 19, 1965, [id number]  Admit date - 12/31/2014   Admitting Physician Ivor Costa, MD  Outpatient Primary MD for the patient is Rubie Maid, MD  LOS - 1   Chief Complaint  Patient presents with  . Cough      HPI on 01/01/2015 by Dr. Ivor Costa Sonya Arnold is a 49 y.o. female with PMH of DVT, pulmonary embolism on, GERD, anxiety, tobacco abuse, who presents with cough and wheezing.  Patient reports that she has been having cough, fever, congestion and fever for 5 days. Patient also has wheezing. Patient states that coughing induced some rib sore. She coughs up yellow colored sputum. Has mild shortness of breath. Patient does not have abdominal pain, diarrhea, symptoms of UTI, hematuria, hematochezia, unilateral weakness. Patient reports that she always have regular, but heavy menstrual period.  In ED, patient was found to have hemoglobin drop from 9.9 in July to 7.7 today, electrolytes and renal function okay, WBC 7.5, temperature normal, heart rate 97, RR 20, oxygen saturation 96%. FOBT negative. Chest x-ray is negative for acute terminal maladies. Patient's admitted to inpatient for further evaluation and treatment.  Assessment & Plan   Bronchitis/Cough -Chest x-ray: No active cardiopulmonary disease -Currently afebrile with no leukocytosis -Continue azithromycin, Mucinex, nebs as needed, IV steroids -Blood cultures and sputum culture pending -Influenza negative  Microcytic anemia -Currently secondary to menorrhagia -FOBT negative -Pelvic ultrasound: No acute abnormality seen within the pelvis, 1.8 cm fibroid, simple cysts left ovary -Hemoglobin 6.9, will transfuse 2 units PRBCs and continue to monitor CBC  History of DVT and PE -Continues Xarelto  Hypertension -Patient is not on any medications at home,  continue to monitor closely  GERD  Tobacco abuse -Continue nicotine patch -Discussed smoking cessation  Code Status: Full   Family Communication: None at bedside.  Disposition Plan: Admitted  Time Spent in minutes   30 minutes  Procedures  Pelvic US  Consults   None  DVT Prophylaxis  Xarelto  Lab Results  Component Value Date   PLT 268 01/01/2015    Medications  Scheduled Meds: . sodium chloride   Intravenous Once  . azithromycin  250 mg Oral Daily  . dextromethorphan-guaiFENesin  1 tablet Oral BID  . famotidine  10 mg Oral BID  . ipratropium-albuterol  3 mL Nebulization TID  . meloxicam  7.5 mg Oral Q breakfast  . methylPREDNISolone (SOLU-MEDROL) injection  60 mg Intravenous Daily  . nicotine  21 mg Transdermal Daily  . rivaroxaban  20 mg Oral Q supper  . sodium chloride  3 mL Intravenous Q12H  . traZODone  100 mg Oral QHS   Continuous Infusions:  PRN Meds:.acetaminophen **OR** acetaminophen, diazepam, HYDROcodone-acetaminophen, menthol-cetylpyridinium, ondansetron **OR** ondansetron (ZOFRAN) IV  Antibiotics    Anti-infectives    Start     Dose/Rate Route Frequency Ordered Stop   01/01/15 1000  azithromycin (ZITHROMAX) tablet 250 mg     250 mg Oral Daily 12/31/14 2340 01/05/15 0959  01/01/15 0000  azithromycin (ZITHROMAX) tablet 500 mg     500 mg Oral Daily 12/31/14 2340 01/01/15 0003      Subjective:   Jaclin Waiters seen and examined today.  Patient continues to complain of cough and sore throat. Feels her breathing is okay. Denies any chest pain, bone pain, changes in urination or bowel pattern. Admits to having heavy periods.  Objective:   Filed Vitals:   01/01/15 0844 01/01/15 0858 01/01/15 1245 01/01/15 1320  BP: 112/72  106/54 113/60  Pulse: 62  80 63  Temp: 98 F (36.7 C)  98.3 F (36.8 C) 98.3 F (36.8 C)  TempSrc: Oral  Oral Oral  Resp: 16  16 16   Height:      Weight:      SpO2: 96% 97% 98% 98%    Wt Readings from Last 3  Encounters:  12/31/14 75.751 kg (167 lb)  12/13/14 81.647 kg (180 lb)     Intake/Output Summary (Last 24 hours) at 01/01/15 1338 Last data filed at 01/01/15 0946  Gross per 24 hour  Intake   1040 ml  Output    350 ml  Net    690 ml    Exam  General: Well developed, well nourished, NAD, appears stated age  86: NCAT,mucous membranes moist.   Cardiovascular: S1 S2 auscultated, no rubs, murmurs or gallops. Regular rate and rhythm.  Respiratory:Diffuse expiratory wheezing.   Abdomen: Soft, nontender, nondistended, + bowel sounds  Extremities: warm dry without cyanosis clubbing or edema  Neuro: AAOx3, nonfocal  Psych: Normal affect and demeanor   Data Review   Micro Results No results found for this or any previous visit (from the past 240 hour(s)).  Radiology Reports Dg Chest 2 View  12/31/2014  CLINICAL DATA:  Cough, fever, wheezing for 4 days EXAM: CHEST  2 VIEW COMPARISON:  10/14/2013 FINDINGS: The heart size and mediastinal contours are within normal limits. Both lungs are clear. The visualized skeletal structures are unremarkable. IMPRESSION: No active cardiopulmonary disease. Electronically Signed   By: Rolm Baptise M.D.   On: 12/31/2014 18:14   Dg Wrist Complete Left  12/13/2014  CLINICAL DATA:  Fall down stairs with left wrist pain, initial encounter EXAM: LEFT WRIST - COMPLETE 3+ VIEW COMPARISON:  None. FINDINGS: There is no evidence of fracture or dislocation. There is no evidence of arthropathy or other focal bone abnormality. Soft tissues are unremarkable. IMPRESSION: No acute abnormality noted. Electronically Signed   By: Inez Catalina M.D.   On: 12/13/2014 18:51   US Transvaginal Non-ob  01/01/2015  CLINICAL DATA:  Acute onset of heavy vaginal bleeding. Initial encounter. EXAM: TRANSABDOMINAL AND TRANSVAGINAL ULTRASOUND OF PELVIS TECHNIQUE: Both transabdominal and transvaginal ultrasound examinations of the pelvis were performed. Transabdominal technique was  performed for global imaging of the pelvis including uterus, ovaries, adnexal regions, and pelvic cul-de-sac. It was necessary to proceed with endovaginal exam following the transabdominal exam to visualize the ovaries in greater detail. COMPARISON:  Pelvic ultrasound performed 11/05/2014 FINDINGS: Uterus Measurements: 9.8 x 4.6 x 4.2 cm. An intramural fibroid is noted along the posterior uterine wall, measuring 1.8 cm. Endometrium Thickness: 0.6 cm.  No focal abnormality visualized. Right ovary Not visualized. Left ovary Measurements: 5.0 x 3.4 x 4.2 cm. Two simple cysts are noted at the left ovary, measuring 3.5 x 3.2 x 3.2 cm, and 3.1 x 2.5 x 2.7 cm, likely physiologic in nature. Other findings No free fluid is seen within the pelvic cul-de-sac. IMPRESSION: 1. No  acute abnormality seen within the pelvis. 2. 1.8 cm intramural fibroid noted.  Uterus otherwise unremarkable. 3. Simple cysts at the left ovary, likely physiologic in nature. Electronically Signed   By: Garald Balding M.D.   On: 01/01/2015 02:37   US Pelvis Complete  01/01/2015  CLINICAL DATA:  Acute onset of heavy vaginal bleeding. Initial encounter. EXAM: TRANSABDOMINAL AND TRANSVAGINAL ULTRASOUND OF PELVIS TECHNIQUE: Both transabdominal and transvaginal ultrasound examinations of the pelvis were performed. Transabdominal technique was performed for global imaging of the pelvis including uterus, ovaries, adnexal regions, and pelvic cul-de-sac. It was necessary to proceed with endovaginal exam following the transabdominal exam to visualize the ovaries in greater detail. COMPARISON:  Pelvic ultrasound performed 11/05/2014 FINDINGS: Uterus Measurements: 9.8 x 4.6 x 4.2 cm. An intramural fibroid is noted along the posterior uterine wall, measuring 1.8 cm. Endometrium Thickness: 0.6 cm.  No focal abnormality visualized. Right ovary Not visualized. Left ovary Measurements: 5.0 x 3.4 x 4.2 cm. Two simple cysts are noted at the left ovary, measuring 3.5 x  3.2 x 3.2 cm, and 3.1 x 2.5 x 2.7 cm, likely physiologic in nature. Other findings No free fluid is seen within the pelvic cul-de-sac. IMPRESSION: 1. No acute abnormality seen within the pelvis. 2. 1.8 cm intramural fibroid noted.  Uterus otherwise unremarkable. 3. Simple cysts at the left ovary, likely physiologic in nature. Electronically Signed   By: Garald Balding M.D.   On: 01/01/2015 02:37   Dg Hand Complete Left  12/13/2014  CLINICAL DATA:  Acute left hand pain after fall down steps this morning. Initial encounter. EXAM: LEFT HAND - COMPLETE 3+ VIEW COMPARISON:  None. FINDINGS: There is no evidence of fracture or dislocation. There is no evidence of arthropathy or other focal bone abnormality. Soft tissues are unremarkable. IMPRESSION: Normal left hand. Electronically Signed   By: Marijo Conception, M.D.   On: 12/13/2014 18:52    CBC  Recent Labs Lab 12/31/14 1745 01/01/15 0610  WBC 7.5 5.3  HGB 7.7* 6.9*  HCT 27.4* 23.8*  PLT 272 268  MCV 69.2* 68.8*  MCH 19.4* 19.9*  MCHC 28.1* 29.0*  RDW 18.0* 18.0*  LYMPHSABS 1.3  --   MONOABS 0.7  --   EOSABS 0.1  --   BASOSABS 0.0  --     Chemistries   Recent Labs Lab 12/31/14 1745 01/01/15 0610  NA 139 139  K 3.8 3.6  CL 108 105  CO2 26 25  GLUCOSE 94 187*  BUN 12 7  CREATININE 0.82 0.66  CALCIUM 8.4* 8.8*  AST  --  19  ALT  --  13*  ALKPHOS  --  57  BILITOT  --  0.4   ------------------------------------------------------------------------------------------------------------------ estimated creatinine clearance is 82.9 mL/min (by C-G formula based on Cr of 0.66). ------------------------------------------------------------------------------------------------------------------ No results for input(s): HGBA1C in the last 72 hours. ------------------------------------------------------------------------------------------------------------------ No results for input(s): CHOL, HDL, LDLCALC, TRIG, CHOLHDL, LDLDIRECT in the  last 72 hours. ------------------------------------------------------------------------------------------------------------------ No results for input(s): TSH, T4TOTAL, T3FREE, THYROIDAB in the last 72 hours.  Invalid input(s): FREET3 ------------------------------------------------------------------------------------------------------------------  Recent Labs  01/01/15 0610  VITAMINB12 213  FOLATE 6.2  FERRITIN 5*  TIBC 407  IRON 7*  RETICCTPCT 0.8    Coagulation profile  Recent Labs Lab 12/31/14 2017  INR 1.43    No results for input(s): DDIMER in the last 72 hours.  Cardiac Enzymes No results for input(s): CKMB, TROPONINI, MYOGLOBIN in the last 168 hours.  Invalid input(s): CK ------------------------------------------------------------------------------------------------------------------ Invalid input(s):  POCBNP    Sami Froh D.O. on 01/01/2015 at 1:38 PM  Between 7am to 7pm - Pager - [phone]  After 7pm go to [email or web address] - password [email or web address]  And look for the night coverage person covering for me after hours  [facility] Office  [phone]

## 2015-01-01 NOTE — CV Procedure (Signed)
Patient a 49 years old white female Admitted from [facility] ,c/o cough & dyspnea with exertion, Initial assessment done. Pt alert and oriented x 4 , follows command .  Denied chest pain nor  SOB on arrival to the unit. Oriented to room and environment. Medications and  safety measures discussed with pt, call light within reach to call for assist as needed. Medications given as ordered . Pt sent down for [place] via W/C and back  to unit. ST on cardiac monitor. No acute distress at present.  RN will continue to monitor pt.

## 2015-01-01 NOTE — Progress Notes (Signed)
Blood consent signed and placed in patient's chart.

## 2015-01-01 NOTE — Progress Notes (Signed)
Utilization review completed. Duke Weisensel, RN, BSN. 

## 2015-01-01 NOTE — H&P (Addendum)
[facility] History and Physical  Sonya Arnold [id number] DOB: February 03, 1965 DOA: 12/31/2014  Referring physician: ED physician PCP: Rubie Maid, MD  Specialists:   Chief Complaint: Cough, fever, wheezing  HPI: Sonya Arnold is a 49 y.o. female with PMH of DVT, pulmonary embolism on, GERD, anxiety, tobacco abuse, who presents with cough and wheezing.  Patient reports that she has been having cough, fever, congestion and fever for 5 days. Patient also has wheezing. Patient states that coughing induced some rib sore. She coughs up yellow colored sputum. Has mild shortness of breath. Patient does not have abdominal pain, diarrhea, symptoms of UTI, hematuria, hematochezia, unilateral weakness. Patient reports that she always have regular, but heavy menstrual period.  In ED, patient was found to have hemoglobin drop from 9.9 in July to 7.7 today, electrolytes and renal function okay, WBC 7.5, temperature normal, heart rate 97, RR 20, oxygen saturation 96%. FOBT negative. Chest x-ray is negative for acute terminal maladies. Patient's admitted to inpatient for further evaluation and treatment.  Where does patient live?   At home    Can patient participate in ADLs?  Yes   Review of Systems:   General: has fevers, chills, no changes in body weight, has fatigue HEENT: no blurry vision, hearing changes or sore throat Pulm: has dyspnea, coughing, wheezing CV: no chest pain, palpitations Abd: no nausea, vomiting, abdominal pain, diarrhea, constipation GU: no dysuria, burning on urination, increased urinary frequency, hematuria  Ext: no leg edema Neuro: no unilateral weakness, numbness, or tingling, no vision change or hearing loss Skin: no rash MSK: No muscle spasm, no deformity, no limitation of range of movement in spin Heme: No easy bruising.  Travel history: No recent long distant travel.  Allergy:  Allergies  Allergen Reactions  . Cataflam [Diclofenac]   . Norvasc  [Amlodipine Besylate] Swelling    Past Medical History  Diagnosis Date  . DVT (deep vein thrombosis) in pregnancy 07/2014    RLE  . PE (pulmonary embolism) 07/2014  . Hypertension   . GERD (gastroesophageal reflux disease)   . Tobacco abuse   . Phlebitis of right leg 1992    "when I was pregnant"  . Chronic bronchitis ([place])     "I"ve had it the last 2 years" (01/01/2015)  . History of blood transfusion 07/2014    "related to blood clot in my RLE"  . Migraine     "2 times/year lately" (01/01/2015)  . Anxiety   . Depression     Past Surgical History  Procedure Laterality Date  . Vein surgery  07/2014    "blood clot from calf to thigh RLE; put catheter in to run RX thru to try to save the vein"  . Laparoscopic cholecystectomy  ~ 2010  . Hernia repair    . Umbilical hernia repair  ~ 2010  . Augmentation mammaplasty Bilateral ~ 2014    " lift"  . Dilation and curettage of uterus    . Tubal ligation    . Laparoscopic gastric sleeve resection  ~ 2012    Social History:  reports that she has been smoking Cigarettes.  She has a 6 pack-year smoking history. She has never used smokeless tobacco. She reports that she does not drink alcohol or use illicit drugs.  Family History:  Family History  Problem Relation Age of Onset  . Hypertension Mother   . Hypertension Father   . Heart attack Father      Prior to Admission medications  Medication Sig Start Date End Date Taking? Authorizing Provider  diazepam (VALIUM) 5 MG tablet Take 10 mg by mouth every 12 (twelve) hours as needed for anxiety.   Yes Historical Provider, MD  traZODone (DESYREL) 100 MG tablet Take 100 mg by mouth at bedtime.   Yes Historical Provider, MD  HYDROcodone-acetaminophen (NORCO/VICODIN) 5-325 MG tablet Take 1-2 tablets by mouth every 6 (six) hours as needed for moderate pain or severe pain. 12/13/14   Hannah Muthersbaugh, PA-C  meloxicam (MOBIC) 7.5 MG tablet Take 7.5 mg by mouth daily.    Historical Provider,  MD  RANITIDINE HCL PO Take by mouth.    Historical Provider, MD  Rivaroxaban (XARELTO) 15 MG TABS tablet Take 15 mg by mouth daily with supper.    Historical Provider, MD    Physical Exam: Filed Vitals:   12/31/14 2103 12/31/14 2104 12/31/14 2240 01/01/15 0023  BP: 130/78 130/78 109/68   Pulse:  102 105   Temp:   99 F (37.2 C)   TempSrc:   Oral   Resp:  20 18   Height:      Weight:   75.751 kg (167 lb)   SpO2:  97% 95% 96%   General: Not in acute distress HEENT:       Eyes: PERRL, EOMI, no scleral icterus.       ENT: No discharge from the ears and nose, no pharynx injection, no tonsillar enlargement.        Neck: No JVD, no bruit, no mass felt. Heme: No neck lymph node enlargement. Cardiac: S1/S2, RRR, No murmurs, No gallops or rubs. Pulm: has wheezing in bilaterally. No rales or rubs. Abd: Soft, nondistended, nontender, no rebound pain, no organomegaly, BS present. Ext: No pitting leg edema bilaterally. 2+DP/PT pulse bilaterally. Musculoskeletal: No joint deformities, No joint redness or warmth, no limitation of ROM in spin. Skin: No rashes.  Neuro: Alert, oriented X3, cranial nerves II-XII grossly intact, muscle strength 5/5 in all extremities, sensation to light touch intact. Psych: Patient is not psychotic, no suicidal or hemocidal ideation.  Labs on Admission:  Basic Metabolic Panel:  Recent Labs Lab 12/31/14 1745  NA 139  K 3.8  CL 108  CO2 26  GLUCOSE 94  BUN 12  CREATININE 0.82  CALCIUM 8.4*   Liver Function Tests: No results for input(s): AST, ALT, ALKPHOS, BILITOT, PROT, ALBUMIN in the last 168 hours. No results for input(s): LIPASE, AMYLASE in the last 168 hours. No results for input(s): AMMONIA in the last 168 hours. CBC:  Recent Labs Lab 12/31/14 1745  WBC 7.5  NEUTROABS 5.3  HGB 7.7*  HCT 27.4*  MCV 69.2*  PLT 272   Cardiac Enzymes: No results for input(s): CKTOTAL, CKMB, CKMBINDEX, TROPONINI in the last 168 hours.  BNP (last 3  results) No results for input(s): BNP in the last 8760 hours.  ProBNP (last 3 results) No results for input(s): PROBNP in the last 8760 hours.  CBG: No results for input(s): GLUCAP in the last 168 hours.  Radiological Exams on Admission: Dg Chest 2 View  12/31/2014  CLINICAL DATA:  Cough, fever, wheezing for 4 days EXAM: CHEST  2 VIEW COMPARISON:  10/14/2013 FINDINGS: The heart size and mediastinal contours are within normal limits. Both lungs are clear. The visualized skeletal structures are unremarkable. IMPRESSION: No active cardiopulmonary disease. Electronically Signed   By: Rolm Baptise M.D.   On: 12/31/2014 18:14   US Transvaginal Non-ob  01/01/2015  CLINICAL DATA:  Acute onset  of heavy vaginal bleeding. Initial encounter. EXAM: TRANSABDOMINAL AND TRANSVAGINAL ULTRASOUND OF PELVIS TECHNIQUE: Both transabdominal and transvaginal ultrasound examinations of the pelvis were performed. Transabdominal technique was performed for global imaging of the pelvis including uterus, ovaries, adnexal regions, and pelvic cul-de-sac. It was necessary to proceed with endovaginal exam following the transabdominal exam to visualize the ovaries in greater detail. COMPARISON:  Pelvic ultrasound performed 11/05/2014 FINDINGS: Uterus Measurements: 9.8 x 4.6 x 4.2 cm. An intramural fibroid is noted along the posterior uterine wall, measuring 1.8 cm. Endometrium Thickness: 0.6 cm.  No focal abnormality visualized. Right ovary Not visualized. Left ovary Measurements: 5.0 x 3.4 x 4.2 cm. Two simple cysts are noted at the left ovary, measuring 3.5 x 3.2 x 3.2 cm, and 3.1 x 2.5 x 2.7 cm, likely physiologic in nature. Other findings No free fluid is seen within the pelvic cul-de-sac. IMPRESSION: 1. No acute abnormality seen within the pelvis. 2. 1.8 cm intramural fibroid noted.  Uterus otherwise unremarkable. 3. Simple cysts at the left ovary, likely physiologic in nature. Electronically Signed   By: Garald Balding M.D.    On: 01/01/2015 02:37   US Pelvis Complete  01/01/2015  CLINICAL DATA:  Acute onset of heavy vaginal bleeding. Initial encounter. EXAM: TRANSABDOMINAL AND TRANSVAGINAL ULTRASOUND OF PELVIS TECHNIQUE: Both transabdominal and transvaginal ultrasound examinations of the pelvis were performed. Transabdominal technique was performed for global imaging of the pelvis including uterus, ovaries, adnexal regions, and pelvic cul-de-sac. It was necessary to proceed with endovaginal exam following the transabdominal exam to visualize the ovaries in greater detail. COMPARISON:  Pelvic ultrasound performed 11/05/2014 FINDINGS: Uterus Measurements: 9.8 x 4.6 x 4.2 cm. An intramural fibroid is noted along the posterior uterine wall, measuring 1.8 cm. Endometrium Thickness: 0.6 cm.  No focal abnormality visualized. Right ovary Not visualized. Left ovary Measurements: 5.0 x 3.4 x 4.2 cm. Two simple cysts are noted at the left ovary, measuring 3.5 x 3.2 x 3.2 cm, and 3.1 x 2.5 x 2.7 cm, likely physiologic in nature. Other findings No free fluid is seen within the pelvic cul-de-sac. IMPRESSION: 1. No acute abnormality seen within the pelvis. 2. 1.8 cm intramural fibroid noted.  Uterus otherwise unremarkable. 3. Simple cysts at the left ovary, likely physiologic in nature. Electronically Signed   By: Garald Balding M.D.   On: 01/01/2015 02:37    EKG: Not done in ED, will get one.   Assessment/Plan Principal Problem:   Bronchitis Active Problems:   Microcytic anemia   DVT (deep venous thrombosis) (HCC)   PE (pulmonary embolism)   Hypertension   GERD (gastroesophageal reflux disease)   Cough   Heavy menstrual period   Tobacco abuse  Bronchitis: Patient's productive cough, shortness of breath and wheezing most likely caused by bronchitis. No pneumonia by chest x-ray. Patient is not septic on admission. Hemodynamically stable.  - will admit tele bed - Z pak for 5 days - Mucinex for cough  - DuoNeb, albuterol, Neb  prn for SOB - Solumedrol 60 mg daily - Urine S. pneumococcal antigen - Follow up blood culture x2, sputum culture, plus Flu pcr  Microcytic anemia: Likely due to heavy menstrual period. Negative FOBT -Anemia panel -Pelvis-US  DVT and PE (pulmonary embolism): -continue Xarelto  GERD: -Pepcid  Tobacco abuse: -Did counseling about importance of quitting smoking -Nicotine patch  HTN: Not on medications at home. Blood pressure 132/85 -Observe blood pressure closely.   DVT ppx: Xarelto  Code Status: Full code Family Communication: None at bed  side.   Disposition Plan: Admit to inpatient   Date of Service 01/01/2015    Ivor Costa [facility] Pager [phone]  If 7PM-7AM, please contact night-coverage [email or web address] Password [email or web address] 01/01/2015, 4:30 AM

## 2015-01-01 NOTE — Accreditation Note (Signed)
Critical  Lab result of hemoglobin of 6.9  MD text awaiting a call from MD . Patientcare endorsed to oncoming RN.

## 2015-01-02 DIAGNOSIS — I10 Essential (primary) hypertension: Secondary | ICD-10-CM

## 2015-01-02 LAB — TYPE AND SCREEN
ABO/RH(D): B POS
ANTIBODY SCREEN: NEGATIVE
UNIT DIVISION: 0
UNIT DIVISION: 0

## 2015-01-02 LAB — CBC
HEMATOCRIT: 31.1 % — AB (ref 36.0–46.0)
HEMATOCRIT: 29.7 % — AB (ref 36.0–46.0)
HEMOGLOBIN: 9.2 g/dL — AB (ref 12.0–15.0)
HEMOGLOBIN: 9 g/dL — AB (ref 12.0–15.0)
MCH: 21.1 pg — AB (ref 26.0–34.0)
MCH: 21.7 pg — ABNORMAL LOW (ref 26.0–34.0)
MCHC: 29.6 g/dL — AB (ref 30.0–36.0)
MCHC: 30.3 g/dL (ref 30.0–36.0)
MCV: 71.2 fL — AB (ref 78.0–100.0)
MCV: 71.6 fL — AB (ref 78.0–100.0)
Platelets: 300 10*3/uL (ref 150–400)
Platelets: 276 10*3/uL (ref 150–400)
RBC: 4.37 MIL/uL (ref 3.87–5.11)
RBC: 4.15 MIL/uL (ref 3.87–5.11)
RDW: 17.9 % — ABNORMAL HIGH (ref 11.5–15.5)
RDW: 18.3 % — AB (ref 11.5–15.5)
WBC: 16.3 10*3/uL — ABNORMAL HIGH (ref 4.0–10.5)
WBC: 12.4 10*3/uL — ABNORMAL HIGH (ref 4.0–10.5)

## 2015-01-02 LAB — BASIC METABOLIC PANEL
Anion gap: 10 (ref 5–15)
BUN: 5 mg/dL — ABNORMAL LOW (ref 6–20)
CHLORIDE: 105 mmol/L (ref 101–111)
CO2: 27 mmol/L (ref 22–32)
CREATININE: 0.66 mg/dL (ref 0.44–1.00)
Calcium: 8.9 mg/dL (ref 8.9–10.3)
GFR calc non Af Amer: >60 mL/min (ref >60)
Glucose, Bld: 81 mg/dL (ref 65–99)
Potassium: 3.1 mmol/L — ABNORMAL LOW (ref 3.5–5.1)
Sodium: 142 mmol/L (ref 135–145)

## 2015-01-02 LAB — LEGIONELLA ANTIGEN, URINE

## 2015-01-02 LAB — EXPECTORATED SPUTUM ASSESSMENT W REFEX TO RESP CULTURE

## 2015-01-02 MED ORDER — POTASSIUM CHLORIDE CRYS ER 20 MEQ PO TBCR
40.0000 meq | EXTENDED_RELEASE_TABLET | Freq: Once | ORAL | Status: AC
  Administered 2015-01-02: 40 meq via ORAL
  Filled 2015-01-02: qty 2

## 2015-01-02 MED ORDER — ALBUTEROL SULFATE (2.5 MG/3ML) 0.083% IN NEBU
INHALATION_SOLUTION | RESPIRATORY_TRACT | Status: AC
  Administered 2015-01-02: 2.5 mg
  Filled 2015-01-02: qty 3

## 2015-01-02 MED ORDER — ALBUTEROL SULFATE (2.5 MG/3ML) 0.083% IN NEBU: 2.5000 mg | INHALATION_SOLUTION | RESPIRATORY_TRACT | Status: DC | PRN

## 2015-01-02 MED ORDER — GUAIFENESIN-DM 100-10 MG/5ML PO SYRP
5.0000 mL | ORAL_SOLUTION | ORAL | Status: DC | PRN
  Filled 2015-01-02: qty 5

## 2015-01-02 MED ORDER — BENZONATATE 100 MG PO CAPS
200.0000 mg | ORAL_CAPSULE | Freq: Two times a day (BID) | ORAL | Status: DC
  Administered 2015-01-02 – 2015-01-03 (×3): 200 mg via ORAL
  Filled 2015-01-02 (×3): qty 2

## 2015-01-02 MED ORDER — PREDNISONE 20 MG PO TABS
40.0000 mg | ORAL_TABLET | Freq: Every day | ORAL | Status: DC
  Administered 2015-01-03: 40 mg via ORAL
  Filled 2015-01-02: qty 2

## 2015-01-02 NOTE — Progress Notes (Signed)
[facility]                                                                              Patient Demographics  Sonya Arnold, is a 49 y.o. female, DOB - 10-Nov-1965, [id number]  Admit date - 12/31/2014   Admitting Physician Ivor Costa, MD  Outpatient Primary MD for the patient is Rubie Maid, MD  LOS - 2   Chief Complaint  Patient presents with  . Cough      HPI on 01/01/2015 by Dr. Ivor Costa Sonya Arnold is a 49 y.o. female with PMH of DVT, pulmonary embolism on, GERD, anxiety, tobacco abuse, who presents with cough and wheezing.  Patient reports that she has been having cough, fever, congestion and fever for 5 days. Patient also has wheezing. Patient states that coughing induced some rib sore. She coughs up yellow colored sputum. Has mild shortness of breath. Patient does not have abdominal pain, diarrhea, symptoms of UTI, hematuria, hematochezia, unilateral weakness. Patient reports that she always have regular, but heavy menstrual period.  In ED, patient was found to have hemoglobin drop from 9.9 in July to 7.7 today, electrolytes and renal function okay, WBC 7.5, temperature normal, heart rate 97, RR 20, oxygen saturation 96%. FOBT negative. Chest x-ray is negative for acute terminal maladies. Patient's admitted to inpatient for further evaluation and treatment.  Assessment & Plan   Bronchitis/Cough -Chest x-ray: No active cardiopulmonary disease -Currently afebrile with no leukocytosis -Continue azithromycin, Mucinex, nebs as needed, steroids -Will transition to oral steroids -Influenza negative  Microcytic anemia -Currently secondary to menorrhagia -FOBT negative -Pelvic ultrasound: No acute abnormality seen within the pelvis, 1.8 cm fibroid, simple cysts left ovary -Hemoglobin 6.9, transfused 2 units PRBCs -Hb 9.2  Leukocytosis  -Likely reactive to steroids -Continue to monitor CBC  History of DVT and PE -Continues Xarelto  Hypertension -Patient is  not on any medications at home, continue to monitor closely  GERD  Tobacco abuse -Continue nicotine patch -Discussed smoking cessation  Code Status: Full   Family Communication: Boyfriend at bedside.  Disposition Plan: Admitted. Discharge likely in 24 hours  Time Spent in minutes   30 minutes  Procedures  Pelvic US  Consults   None  DVT Prophylaxis  Xarelto  Lab Results  Component Value Date   PLT 300 01/02/2015    Medications  Scheduled Meds: . sodium chloride   Intravenous Once  . azithromycin  250 mg Oral Daily  . benzonatate  200 mg Oral BID  . dextromethorphan-guaiFENesin  1 tablet Oral BID  . famotidine  10 mg Oral BID  . ipratropium-albuterol  3 mL Nebulization TID  . meloxicam  7.5 mg Oral Q breakfast  . methylPREDNISolone (SOLU-MEDROL) injection  60 mg Intravenous Daily  . nicotine  21 mg Transdermal Daily  . rivaroxaban  20 mg Oral Q supper  . sodium chloride  3 mL Intravenous Q12H  . traZODone  100 mg Oral QHS   Continuous Infusions:  PRN Meds:.acetaminophen **OR** acetaminophen, albuterol, diazepam, HYDROcodone-acetaminophen, menthol-cetylpyridinium, ondansetron **OR** ondansetron (ZOFRAN) IV  Antibiotics    Anti-infectives    Start     Dose/Rate Route Frequency Ordered Stop   01/01/15 1000  azithromycin [facility]) tablet 250 mg     250 mg Oral Daily 12/31/14 2340 01/05/15 0959   01/01/15 0000  azithromycin (ZITHROMAX) tablet 500 mg     500 mg Oral Daily 12/31/14 2340 01/01/15 0003      Subjective:   Sonya Arnold seen and examined today.  Patient continues to complain of cough and sore throat. Feels her breathing has improved mildly.  Denies any chest pain, bone pain, changes in urination or bowel pattern.  Objective:   Filed Vitals:   01/01/15 2032 01/02/15 0043 01/02/15 0522 01/02/15 0735  BP: 99/61  117/63   Pulse: 101 102 76   Temp: 97.8 F (36.6 C)  97.7 F (36.5 C)   TempSrc:      Resp: 20 20 18    Height:      Weight: 75.751  kg (167 lb)     SpO2: 96%  96% 95%    Wt Readings from Last 3 Encounters:  01/01/15 75.751 kg (167 lb)  12/13/14 81.647 kg (180 lb)     Intake/Output Summary (Last 24 hours) at 01/02/15 1021 Last data filed at 01/02/15 0755  Gross per 24 hour  Intake   1180 ml  Output   1650 ml  Net   -470 ml    Exam  General: Well developed, well nourished, NAD  HEENT: NCAT,mucous membranes moist.   Cardiovascular: S1 S2 auscultated,RRR, no murmurs  Respiratory:Diffuse expiratory wheezing. Occ cough  Abdomen: Soft, nontender, nondistended, + bowel sounds  Extremities: warm dry without cyanosis clubbing or edema  Neuro: AAOx3, nonfocal  Psych: Normal affect and demeanor   Data Review   Micro Results No results found for this or any previous visit (from the past 240 hour(s)).  Radiology Reports Dg Chest 2 View  12/31/2014  CLINICAL DATA:  Cough, fever, wheezing for 4 days EXAM: CHEST  2 VIEW COMPARISON:  10/14/2013 FINDINGS: The heart size and mediastinal contours are within normal limits. Both lungs are clear. The visualized skeletal structures are unremarkable. IMPRESSION: No active cardiopulmonary disease. Electronically Signed   By: Rolm Baptise M.D.   On: 12/31/2014 18:14   Dg Wrist Complete Left  12/13/2014  CLINICAL DATA:  Fall down stairs with left wrist pain, initial encounter EXAM: LEFT WRIST - COMPLETE 3+ VIEW COMPARISON:  None. FINDINGS: There is no evidence of fracture or dislocation. There is no evidence of arthropathy or other focal bone abnormality. Soft tissues are unremarkable. IMPRESSION: No acute abnormality noted. Electronically Signed   By: Inez Catalina M.D.   On: 12/13/2014 18:51   US Transvaginal Non-ob  01/01/2015  CLINICAL DATA:  Acute onset of heavy vaginal bleeding. Initial encounter. EXAM: TRANSABDOMINAL AND TRANSVAGINAL ULTRASOUND OF PELVIS TECHNIQUE: Both transabdominal and transvaginal ultrasound examinations of the pelvis were performed.  Transabdominal technique was performed for global imaging of the pelvis including uterus, ovaries, adnexal regions, and pelvic cul-de-sac. It was necessary to proceed with endovaginal exam following the transabdominal exam to visualize the ovaries in greater detail. COMPARISON:  Pelvic ultrasound performed 11/05/2014 FINDINGS: Uterus Measurements: 9.8 x 4.6 x 4.2 cm. An intramural fibroid is noted along the posterior uterine wall, measuring 1.8 cm. Endometrium Thickness: 0.6 cm.  No focal abnormality visualized. Right ovary Not visualized. Left ovary Measurements: 5.0 x 3.4 x 4.2 cm. Two simple cysts are noted at the left ovary, measuring 3.5 x 3.2 x 3.2 cm, and 3.1 x 2.5 x 2.7 cm, likely physiologic in nature. Other findings No free fluid is seen within the  pelvic cul-de-sac. IMPRESSION: 1. No acute abnormality seen within the pelvis. 2. 1.8 cm intramural fibroid noted.  Uterus otherwise unremarkable. 3. Simple cysts at the left ovary, likely physiologic in nature. Electronically Signed   By: Garald Balding M.D.   On: 01/01/2015 02:37   US Pelvis Complete  01/01/2015  CLINICAL DATA:  Acute onset of heavy vaginal bleeding. Initial encounter. EXAM: TRANSABDOMINAL AND TRANSVAGINAL ULTRASOUND OF PELVIS TECHNIQUE: Both transabdominal and transvaginal ultrasound examinations of the pelvis were performed. Transabdominal technique was performed for global imaging of the pelvis including uterus, ovaries, adnexal regions, and pelvic cul-de-sac. It was necessary to proceed with endovaginal exam following the transabdominal exam to visualize the ovaries in greater detail. COMPARISON:  Pelvic ultrasound performed 11/05/2014 FINDINGS: Uterus Measurements: 9.8 x 4.6 x 4.2 cm. An intramural fibroid is noted along the posterior uterine wall, measuring 1.8 cm. Endometrium Thickness: 0.6 cm.  No focal abnormality visualized. Right ovary Not visualized. Left ovary Measurements: 5.0 x 3.4 x 4.2 cm. Two simple cysts are noted at the  left ovary, measuring 3.5 x 3.2 x 3.2 cm, and 3.1 x 2.5 x 2.7 cm, likely physiologic in nature. Other findings No free fluid is seen within the pelvic cul-de-sac. IMPRESSION: 1. No acute abnormality seen within the pelvis. 2. 1.8 cm intramural fibroid noted.  Uterus otherwise unremarkable. 3. Simple cysts at the left ovary, likely physiologic in nature. Electronically Signed   By: Garald Balding M.D.   On: 01/01/2015 02:37   Dg Hand Complete Left  12/13/2014  CLINICAL DATA:  Acute left hand pain after fall down steps this morning. Initial encounter. EXAM: LEFT HAND - COMPLETE 3+ VIEW COMPARISON:  None. FINDINGS: There is no evidence of fracture or dislocation. There is no evidence of arthropathy or other focal bone abnormality. Soft tissues are unremarkable. IMPRESSION: Normal left hand. Electronically Signed   By: Marijo Conception, M.D.   On: 12/13/2014 18:52    CBC  Recent Labs Lab 12/31/14 1745 01/01/15 0610 01/02/15 0734  WBC 7.5 5.3 16.3*  HGB 7.7* 6.9* 9.2*  HCT 27.4* 23.8* 31.1*  PLT 272 268 300  MCV 69.2* 68.8* 71.2*  MCH 19.4* 19.9* 21.1*  MCHC 28.1* 29.0* 29.6*  RDW 18.0* 18.0* 17.9*  LYMPHSABS 1.3  --   --   MONOABS 0.7  --   --   EOSABS 0.1  --   --   BASOSABS 0.0  --   --     Chemistries   Recent Labs Lab 12/31/14 1745 01/01/15 0610 01/02/15 0734  NA 139 139 142  K 3.8 3.6 3.1*  CL 108 105 105  CO2 26 25 27   GLUCOSE 94 187* 81  BUN 12 7 5*  CREATININE 0.82 0.66 0.66  CALCIUM 8.4* 8.8* 8.9  AST  --  19  --   ALT  --  13*  --   ALKPHOS  --  57  --   BILITOT  --  0.4  --    ------------------------------------------------------------------------------------------------------------------ estimated creatinine clearance is 82.9 mL/min (by C-G formula based on Cr of 0.66). ------------------------------------------------------------------------------------------------------------------ No results for input(s): HGBA1C in the last 72  hours. ------------------------------------------------------------------------------------------------------------------ No results for input(s): CHOL, HDL, LDLCALC, TRIG, CHOLHDL, LDLDIRECT in the last 72 hours. ------------------------------------------------------------------------------------------------------------------ No results for input(s): TSH, T4TOTAL, T3FREE, THYROIDAB in the last 72 hours.  Invalid input(s): FREET3 ------------------------------------------------------------------------------------------------------------------  Recent Labs  01/01/15 0610  VITAMINB12 213  FOLATE 6.2  FERRITIN 5*  TIBC 407  IRON 7*  RETICCTPCT 0.8  Coagulation profile  Recent Labs Lab 12/31/14 2017  INR 1.43    No results for input(s): DDIMER in the last 72 hours.  Cardiac Enzymes No results for input(s): CKMB, TROPONINI, MYOGLOBIN in the last 168 hours.  Invalid input(s): CK ------------------------------------------------------------------------------------------------------------------ Invalid input(s): POCBNP    Karsten Howry D.O. on 01/02/2015 at 10:21 AM  Between 7am to 7pm - Pager - [phone]  After 7pm go to [email or web address] - password [email or web address]  And look for the night coverage person covering for me after hours  [facility] Office  [phone]

## 2015-01-03 LAB — BASIC METABOLIC PANEL
ANION GAP: 7 (ref 5–15)
BUN: 8 mg/dL (ref 6–20)
CALCIUM: 8.7 mg/dL — AB (ref 8.9–10.3)
CO2: 28 mmol/L (ref 22–32)
CREATININE: 0.72 mg/dL (ref 0.44–1.00)
Chloride: 105 mmol/L (ref 101–111)
GFR calc Af Amer: >60 mL/min (ref >60)
GLUCOSE: 94 mg/dL (ref 65–99)
Potassium: 3.9 mmol/L (ref 3.5–5.1)
Sodium: 140 mmol/L (ref 135–145)

## 2015-01-03 MED ORDER — NICOTINE 21 MG/24HR TD PT24: 21.0000 mg | MEDICATED_PATCH | Freq: Every day | TRANSDERMAL | Status: AC

## 2015-01-03 MED ORDER — RIVAROXABAN 20 MG PO TABS: 20.0000 mg | ORAL_TABLET | Freq: Every day | ORAL | Status: AC

## 2015-01-03 MED ORDER — BENZONATATE 200 MG PO CAPS: 200.0000 mg | ORAL_CAPSULE | Freq: Two times a day (BID) | ORAL | Status: AC

## 2015-01-03 MED ORDER — GUAIFENESIN-DM 100-10 MG/5ML PO SYRP: 5.0000 mL | ORAL_SOLUTION | ORAL | Status: AC | PRN

## 2015-01-03 MED ORDER — ALBUTEROL SULFATE (2.5 MG/3ML) 0.083% IN NEBU: 2.5000 mg | INHALATION_SOLUTION | RESPIRATORY_TRACT | Status: AC | PRN

## 2015-01-03 MED ORDER — PREDNISONE 10 MG PO TABS: ORAL_TABLET | ORAL | Status: AC

## 2015-01-03 MED ORDER — AZITHROMYCIN 250 MG PO TABS: 250.0000 mg | ORAL_TABLET | Freq: Every day | ORAL | Status: AC

## 2015-01-03 MED ORDER — MENTHOL 3 MG MT LOZG: 1.0000 | LOZENGE | OROMUCOSAL | Status: AC | PRN

## 2015-01-03 MED ORDER — IPRATROPIUM-ALBUTEROL 0.5-2.5 (3) MG/3ML IN SOLN: 3.0000 mL | Freq: Three times a day (TID) | RESPIRATORY_TRACT | Status: AC

## 2015-01-03 NOTE — Progress Notes (Signed)
Patient to be discharged to home via family. Telemetry box #26 removed and returned to nurse's station. PIV removed. Discharge instructions, follow up appts, and new medication education reviewed with patient. Appropriate handouts given. Flu rec'd flu shot this admission. Currently waiting on [facility] to deliver home nebulizer and then patient can be discharged.  Joellen Jersey, RN.

## 2015-01-03 NOTE — Discharge Instructions (Signed)

## 2015-01-03 NOTE — Discharge Summary (Addendum)
Physician Discharge Summary  Sonya Arnold [id number] DOB: [birth year] DOA: 12/31/2014  PCP: Rubie Maid, MD  Admit date: 12/31/2014 Discharge date: 01/03/2015  Time spent: 45 minutes  Recommendations for Outpatient Follow-up:  Patient will be discharged to home.  Patient will need to follow up with primary care provider within one week of discharge, repeat CBC in one week.   Follow up with gynecologist.  Patient should continue medications as prescribed.  Patient should follow a regular diet.  Advised to quit smoking.  Discharge Diagnoses:  Principal Problem:   Bronchitis Active Problems:   Microcytic anemia   DVT (deep venous thrombosis) (HCC)   PE (pulmonary embolism)   Hypertension   GERD (gastroesophageal reflux disease)   Cough   Heavy menstrual period   Tobacco abuse   Discharge Condition: Stable  Diet recommendation: Regular  Filed Weights   12/31/14 2240 01/01/15 2032 01/03/15 0433  Weight: 75.751 kg (167 lb) 75.751 kg (167 lb) 76.5 kg (168 lb 10.4 oz)    History of present illness:  on 01/01/2015 by Dr. Aron Baba Caputi is a 49 y.o. female with PMH of DVT, pulmonary embolism on, GERD, anxiety, tobacco abuse, who presents with cough and wheezing.  Patient reports that she has been having cough, fever, congestion and fever for 5 days. Patient also has wheezing. Patient states that coughing induced some rib sore. She coughs up yellow colored sputum. Has mild shortness of breath. Patient does not have abdominal pain, diarrhea, symptoms of UTI, hematuria, hematochezia, unilateral weakness. Patient reports that she always have regular, but heavy menstrual period.  In ED, patient was found to have hemoglobin drop from 9.9 in July to 7.7 today, electrolytes and renal function okay, WBC 7.5, temperature normal, heart rate 97, RR 20, oxygen saturation 96%. FOBT negative. Chest x-ray is negative for acute terminal maladies. Patient's admitted to inpatient for  further evaluation and treatment.  Hospital Course:  Bronchitis/Cough -Improving.  Patient has forced exp wheezing -Chest x-ray: No active cardiopulmonary disease -Currently afebrile -Continue azithromycin, Mucinex, nebs as needed, steroids -Influenza negative -patient would like a nebulizer at home  Microcytic anemia -Currently secondary to menorrhagia -FOBT negative -Pelvic ultrasound: No acute abnormality seen within the pelvis, 1.8 cm fibroid, simple cysts left ovary -Hemoglobin 6.9, transfused 2 units PRBCs -Hb 9.0 -Repeat CBC in one week  Leukocytosis  -improving, Likely reactive to steroids -Repeat CBC in one week  History of DVT and PE -Continues Xarelto  Hypertension -Patient is not on any medications at home, continue to monitor closely  GERD  Tobacco abuse -Continue nicotine patch -Discussed smoking cessation  Procedures  Pelvic US  Consults  None  Discharge Exam: Filed Vitals:   01/03/15 0433 01/03/15 0843  BP: 128/62 135/66  Pulse: 67 75  Temp: 97.9 F (36.6 C) 98 F (36.7 C)  Resp: 16 17   Exam  General: Well developed, well nourished, NAD  HEENT: NCAT,mucous membranes moist.   Cardiovascular: S1 S2 auscultated,RRR, no murmurs  Respiratory:Clear, patient has FORCED EXP wheezing  Abdomen: Soft, nontender, nondistended, + bowel sounds  Extremities: warm dry without cyanosis clubbing or edema  Neuro: AAOx3, nonfocal  Psych: Normal affect and demeanor  Discharge Instructions      Discharge Instructions    Discharge instructions    Complete by:  As directed   Patient will be discharged to home.  Patient will need to follow up with primary care provider within one week of discharge, repeat CBC in one week.  Follow up with gynecologist.  Patient should continue medications as prescribed.  Patient should follow a regular diet.  Advised to quit smoking.            Medication List    TAKE these medications        albuterol  (2.5 MG/3ML) 0.083% nebulizer solution  Commonly known as:  PROVENTIL  Take 3 mLs (2.5 mg total) by nebulization every 4 (four) hours as needed for wheezing or shortness of breath.     azithromycin 250 MG tablet  Commonly known as:  ZITHROMAX  Take 1 tablet (250 mg total) by mouth daily.     benzonatate 200 MG capsule  Commonly known as:  TESSALON  Take 1 capsule (200 mg total) by mouth 2 (two) times daily.     diazepam 10 MG tablet  Commonly known as:  VALIUM  Take 10 mg by mouth daily.     guaiFENesin 600 MG 12 hr tablet  Commonly known as:  MUCINEX  Take 600 mg by mouth 2 (two) times daily as needed for cough.     guaiFENesin-dextromethorphan 100-10 MG/5ML syrup  Commonly known as:  ROBITUSSIN DM  Take 5 mLs by mouth every 4 (four) hours as needed for cough.     HYDROcodone-acetaminophen 5-325 MG tablet  Commonly known as:  NORCO/VICODIN  Take 1-2 tablets by mouth every 6 (six) hours as needed for moderate pain or severe pain.     ibuprofen 200 MG tablet  Commonly known as:  ADVIL,MOTRIN  Take 200 mg by mouth every 6 (six) hours as needed for moderate pain.     ipratropium-albuterol 0.5-2.5 (3) MG/3ML Soln  Commonly known as:  DUONEB  Take 3 mLs by nebulization 3 (three) times daily.     meloxicam 7.5 MG tablet  Commonly known as:  MOBIC  Take 7.5 mg by mouth daily.     menthol-cetylpyridinium 3 MG lozenge  Commonly known as:  CEPACOL  Take 1 lozenge (3 mg total) by mouth as needed for sore throat.     nicotine 21 mg/24hr patch  Commonly known as:  NICODERM CQ - dosed in mg/24 hours  Place 1 patch (21 mg total) onto the skin daily.     predniSONE 10 MG tablet  Commonly known as:  DELTASONE  Take 40mg  (4 tabs) x 3 days, then taper to 30mg  (3 tabs) x 3 days, then 20mg  (2 tabs) x 3days, then 10mg  (1 tab) x 3days, then Stop     ranitidine 150 MG tablet  Commonly known as:  ZANTAC  Take 150 mg by mouth at bedtime.     rivaroxaban 20 MG Tabs tablet  Commonly  known as:  XARELTO  Take 1 tablet (20 mg total) by mouth daily with supper.     traZODone 100 MG tablet  Commonly known as:  DESYREL  Take 200 mg by mouth at bedtime.       Allergies  Allergen Reactions  . Cataflam [Diclofenac]   . Norvasc [Amlodipine Besylate] Swelling   Follow-up Information    Follow up with Rubie Maid, MD. Schedule an appointment as soon as possible for a visit in 1 week.   Specialty:  Family Medicine   Why:  Hospital follow up, repeat CBC   Contact information:   [address] [place] [address] [phone]        The results of significant diagnostics from this hospitalization (including imaging, microbiology, ancillary and laboratory) are listed below for reference.  Significant Diagnostic Studies: Dg Chest 2 View  12/31/2014  CLINICAL DATA:  Cough, fever, wheezing for 4 days EXAM: CHEST  2 VIEW COMPARISON:  10/14/2013 FINDINGS: The heart size and mediastinal contours are within normal limits. Both lungs are clear. The visualized skeletal structures are unremarkable. IMPRESSION: No active cardiopulmonary disease. Electronically Signed   By: Rolm Baptise M.D.   On: 12/31/2014 18:14   Dg Wrist Complete Left  12/13/2014  CLINICAL DATA:  Fall down stairs with left wrist pain, initial encounter EXAM: LEFT WRIST - COMPLETE 3+ VIEW COMPARISON:  None. FINDINGS: There is no evidence of fracture or dislocation. There is no evidence of arthropathy or other focal bone abnormality. Soft tissues are unremarkable. IMPRESSION: No acute abnormality noted. Electronically Signed   By: Inez Catalina M.D.   On: 12/13/2014 18:51   US Transvaginal Non-ob  01/01/2015  CLINICAL DATA:  Acute onset of heavy vaginal bleeding. Initial encounter. EXAM: TRANSABDOMINAL AND TRANSVAGINAL ULTRASOUND OF PELVIS TECHNIQUE: Both transabdominal and transvaginal ultrasound examinations of the pelvis were performed. Transabdominal technique was performed for global imaging of  the pelvis including uterus, ovaries, adnexal regions, and pelvic cul-de-sac. It was necessary to proceed with endovaginal exam following the transabdominal exam to visualize the ovaries in greater detail. COMPARISON:  Pelvic ultrasound performed 11/05/2014 FINDINGS: Uterus Measurements: 9.8 x 4.6 x 4.2 cm. An intramural fibroid is noted along the posterior uterine wall, measuring 1.8 cm. Endometrium Thickness: 0.6 cm.  No focal abnormality visualized. Right ovary Not visualized. Left ovary Measurements: 5.0 x 3.4 x 4.2 cm. Two simple cysts are noted at the left ovary, measuring 3.5 x 3.2 x 3.2 cm, and 3.1 x 2.5 x 2.7 cm, likely physiologic in nature. Other findings No free fluid is seen within the pelvic cul-de-sac. IMPRESSION: 1. No acute abnormality seen within the pelvis. 2. 1.8 cm intramural fibroid noted.  Uterus otherwise unremarkable. 3. Simple cysts at the left ovary, likely physiologic in nature. Electronically Signed   By: Garald Balding M.D.   On: 01/01/2015 02:37   US Pelvis Complete  01/01/2015  CLINICAL DATA:  Acute onset of heavy vaginal bleeding. Initial encounter. EXAM: TRANSABDOMINAL AND TRANSVAGINAL ULTRASOUND OF PELVIS TECHNIQUE: Both transabdominal and transvaginal ultrasound examinations of the pelvis were performed. Transabdominal technique was performed for global imaging of the pelvis including uterus, ovaries, adnexal regions, and pelvic cul-de-sac. It was necessary to proceed with endovaginal exam following the transabdominal exam to visualize the ovaries in greater detail. COMPARISON:  Pelvic ultrasound performed 11/05/2014 FINDINGS: Uterus Measurements: 9.8 x 4.6 x 4.2 cm. An intramural fibroid is noted along the posterior uterine wall, measuring 1.8 cm. Endometrium Thickness: 0.6 cm.  No focal abnormality visualized. Right ovary Not visualized. Left ovary Measurements: 5.0 x 3.4 x 4.2 cm. Two simple cysts are noted at the left ovary, measuring 3.5 x 3.2 x 3.2 cm, and 3.1 x 2.5 x  2.7 cm, likely physiologic in nature. Other findings No free fluid is seen within the pelvic cul-de-sac. IMPRESSION: 1. No acute abnormality seen within the pelvis. 2. 1.8 cm intramural fibroid noted.  Uterus otherwise unremarkable. 3. Simple cysts at the left ovary, likely physiologic in nature. Electronically Signed   By: Garald Balding M.D.   On: 01/01/2015 02:37   Dg Hand Complete Left  12/13/2014  CLINICAL DATA:  Acute left hand pain after fall down steps this morning. Initial encounter. EXAM: LEFT HAND - COMPLETE 3+ VIEW COMPARISON:  None. FINDINGS: There is no evidence of fracture or dislocation.  There is no evidence of arthropathy or other focal bone abnormality. Soft tissues are unremarkable. IMPRESSION: Normal left hand. Electronically Signed   By: Marijo Conception, M.D.   On: 12/13/2014 18:52    Microbiology: Recent Results (from the past 240 hour(s))  Culture, sputum-assessment     Status: None   Collection Time: 01/02/15 10:01 AM  Result Value Ref Range Status   Specimen Description SPUTUM  Final   Special Requests NONE  Final   Sputum evaluation   Final    THIS SPECIMEN IS ACCEPTABLE. RESPIRATORY CULTURE REPORT TO FOLLOW.   Report Status 01/02/2015 FINAL  Final     Labs: Basic Metabolic Panel:  Recent Labs Lab 12/31/14 1745 01/01/15 0610 01/02/15 0734 01/03/15 0547  NA 139 139 142 140  K 3.8 3.6 3.1* 3.9  CL 108 105 105 105  CO2 26 25 27 28   GLUCOSE 94 187* 81 94  BUN 12 7 5* 8  CREATININE 0.82 0.66 0.66 0.72  CALCIUM 8.4* 8.8* 8.9 8.7*   Liver Function Tests:  Recent Labs Lab 01/01/15 0610  AST 19  ALT 13*  ALKPHOS 57  BILITOT 0.4  PROT 6.3*  ALBUMIN 3.3*   No results for input(s): LIPASE, AMYLASE in the last 168 hours. No results for input(s): AMMONIA in the last 168 hours. CBC:  Recent Labs Lab 12/31/14 1745 01/01/15 0610 01/02/15 0734 01/02/15 1227  WBC 7.5 5.3 16.3* 12.4*  NEUTROABS 5.3  --   --   --   HGB 7.7* 6.9* 9.2* 9.0*  HCT 27.4*  23.8* 31.1* 29.7*  MCV 69.2* 68.8* 71.2* 71.6*  PLT 272 268 300 276   Cardiac Enzymes: No results for input(s): CKTOTAL, CKMB, CKMBINDEX, TROPONINI in the last 168 hours. BNP: BNP (last 3 results) No results for input(s): BNP in the last 8760 hours.  ProBNP (last 3 results) No results for input(s): PROBNP in the last 8760 hours.  CBG: No results for input(s): GLUCAP in the last 168 hours.     SignedCristal Ford  [facility] 01/03/2015, 9:52 AM  Addendum: Acute bronchitis. (Coding query)

## 2015-01-04 LAB — CULTURE, RESPIRATORY: CULTURE: NORMAL

## 2016-07-02 IMAGING — US US PELVIS COMPLETE
1 series · 13 of 25 positions shown · non-contrast
Comparison: Pelvic ultrasound performed 11/05/2014

CLINICAL DATA: Acute onset of heavy vaginal bleeding. Initial
encounter.

EXAM:
TRANSABDOMINAL AND TRANSVAGINAL ULTRASOUND OF PELVIS
TECHNIQUE: Both transabdominal and transvaginal ultrasound examinations of the
pelvis were performed. Transabdominal technique was performed for
global imaging of the pelvis including uterus, ovaries, adnexal
regions, and pelvic cul-de-sac. It was necessary to proceed with
endovaginal exam following the transabdominal exam to visualize the
ovaries in greater detail.

[Series 1: us pelvis complete · 0.22mm/px · 13 of 45 slices shown]
[im 1/45]
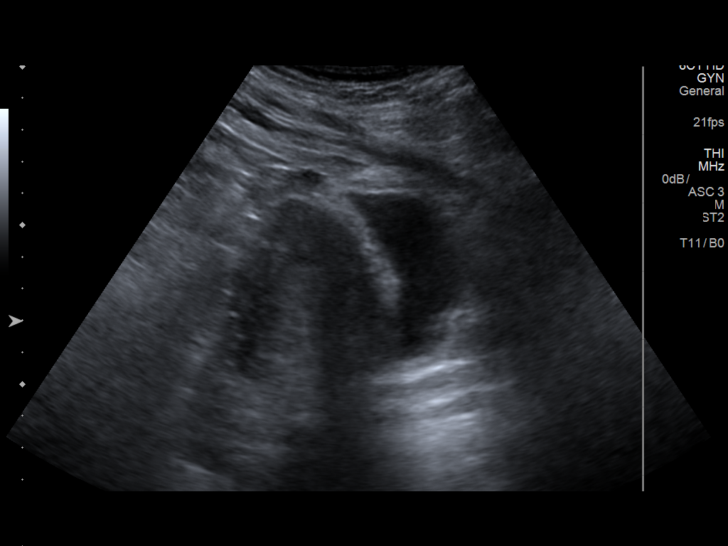
[im 4/45]
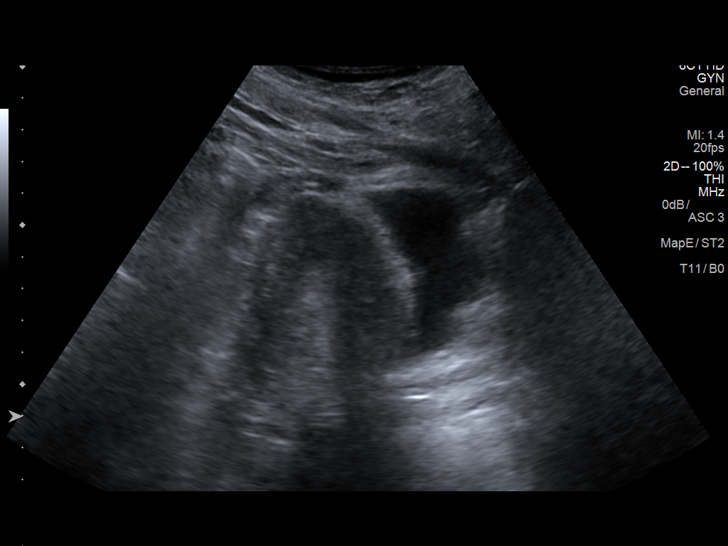
[im 8/45]
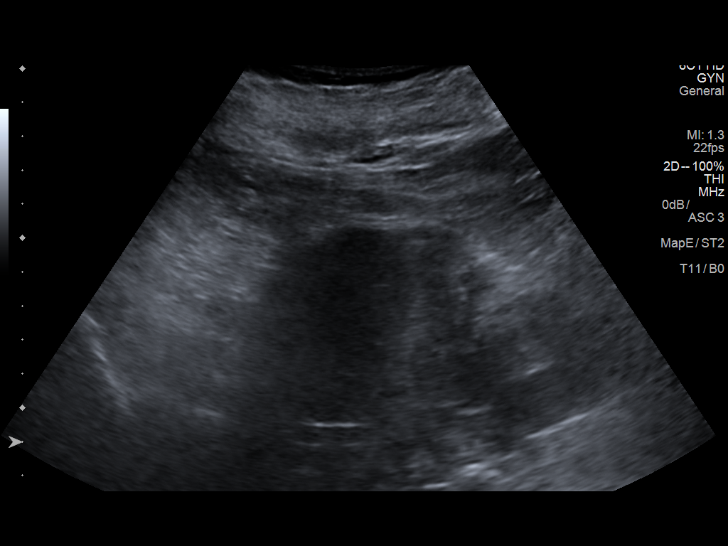
[im 12/45]
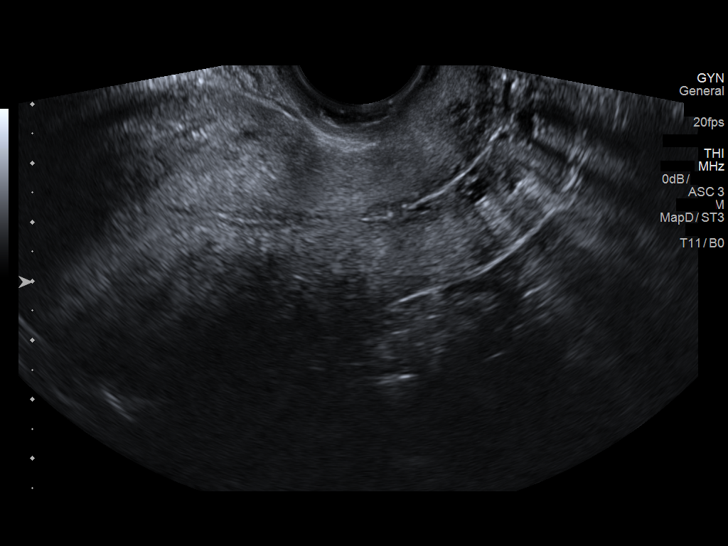
[im 15/45]
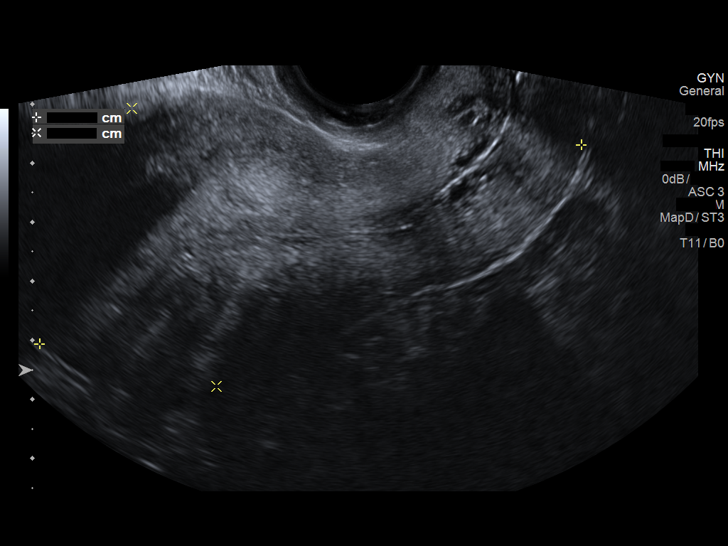
[im 19/45]
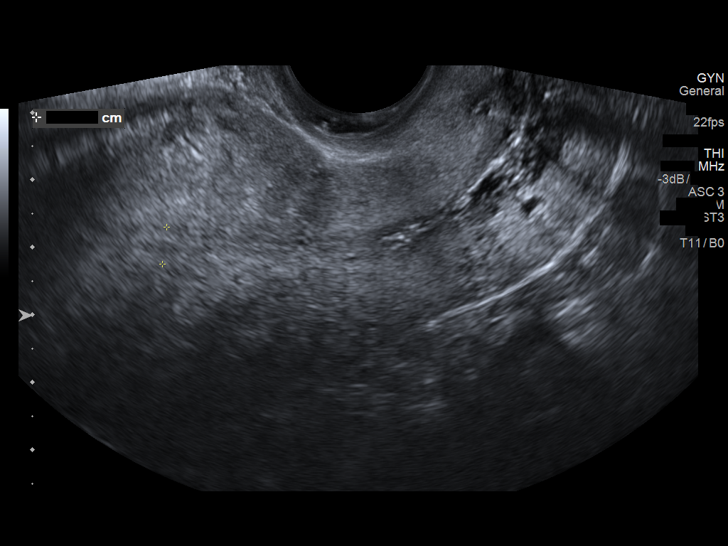
[im 23/45]
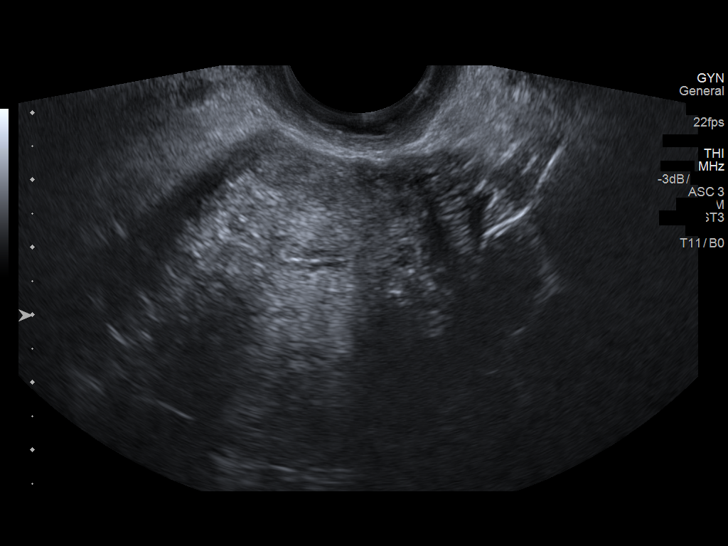
[im 26/45]
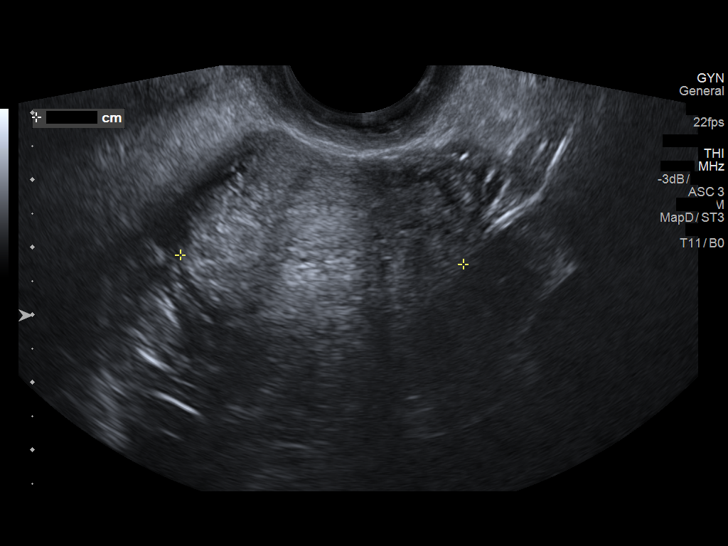
[im 30/45]
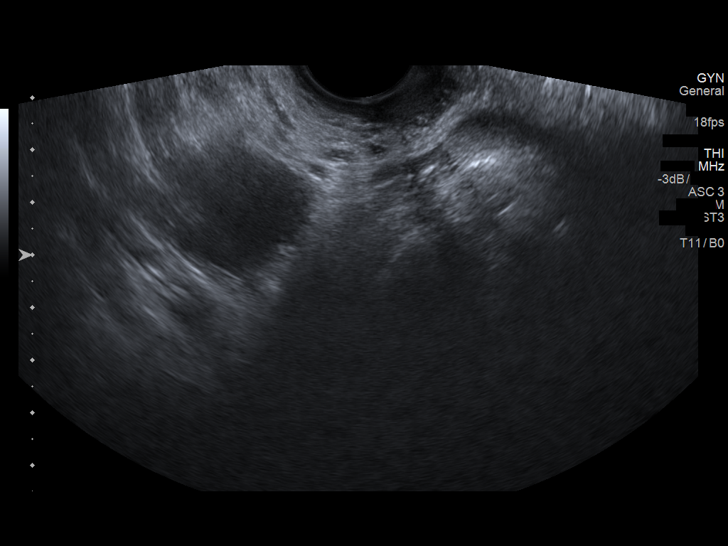
[im 34/45]
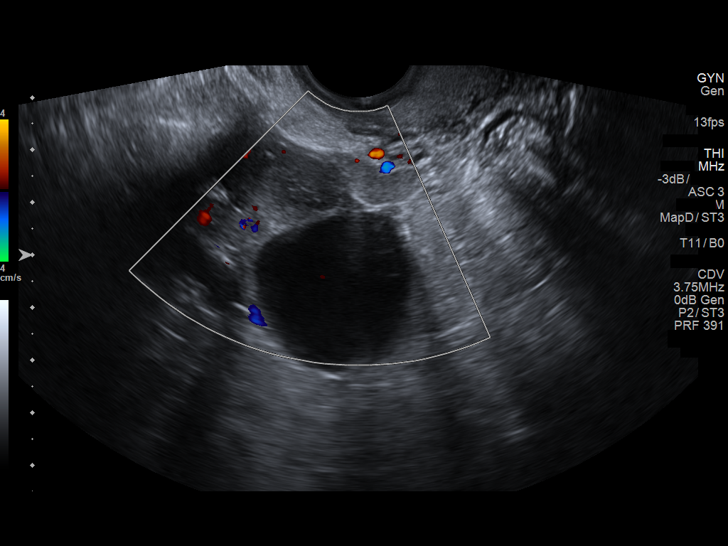
[im 37/45]
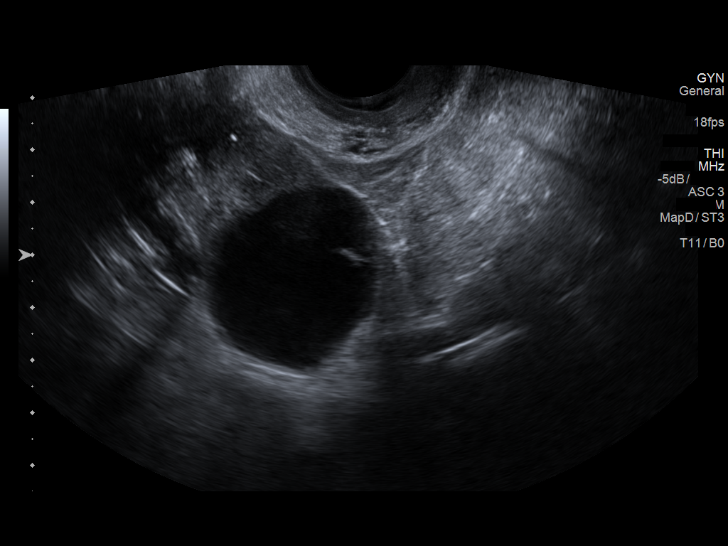
[im 41/45]
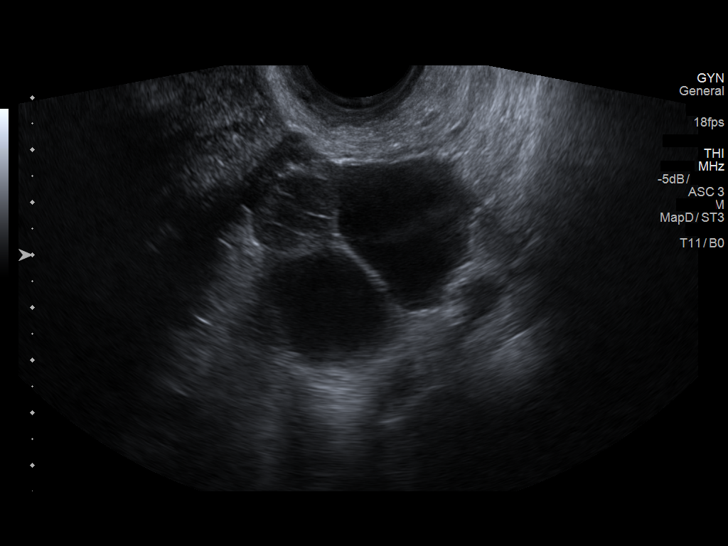
[im 45/45]
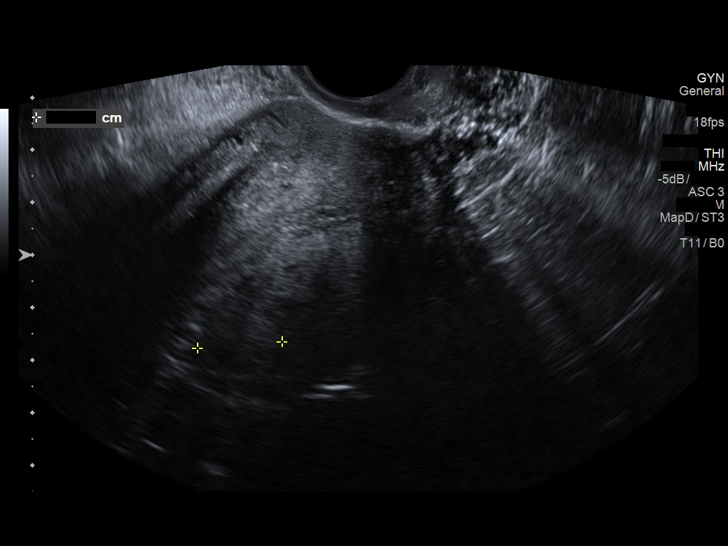

[13 of 25 positions shown; findings below may reference images not displayed]

FINDINGS: Uterus

Measurements: 9.8 x 4.6 x 4.2 cm. An intramural fibroid is noted
along the posterior uterine wall, measuring 1.8 cm.

Endometrium

Thickness: 0.6 cm.  No focal abnormality visualized.

Right ovary

Not visualized.

Left ovary

Measurements: 5.0 x 3.4 x 4.2 cm. Two simple cysts are noted at the
left ovary, measuring 3.5 x 3.2 x 3.2 cm, and 3.1 x 2.5 x 2.7 cm,
likely physiologic in nature.

Other findings

No free fluid is seen within the pelvic cul-de-sac.
IMPRESSION: 1. No acute abnormality seen within the pelvis.
[DATE] cm intramural fibroid noted.  Uterus otherwise unremarkable.
3. Simple cysts at the left ovary, likely physiologic in nature.

## 2016-07-03 IMAGING — DX DG HAND COMPLETE 3+V*L*
3 series · 3 of 3 positions shown · non-contrast
Comparison: None.

CLINICAL DATA: Acute left hand pain after fall down steps this
morning. Initial encounter.

EXAM:
LEFT HAND - COMPLETE 3+ VIEW

[hand pa]
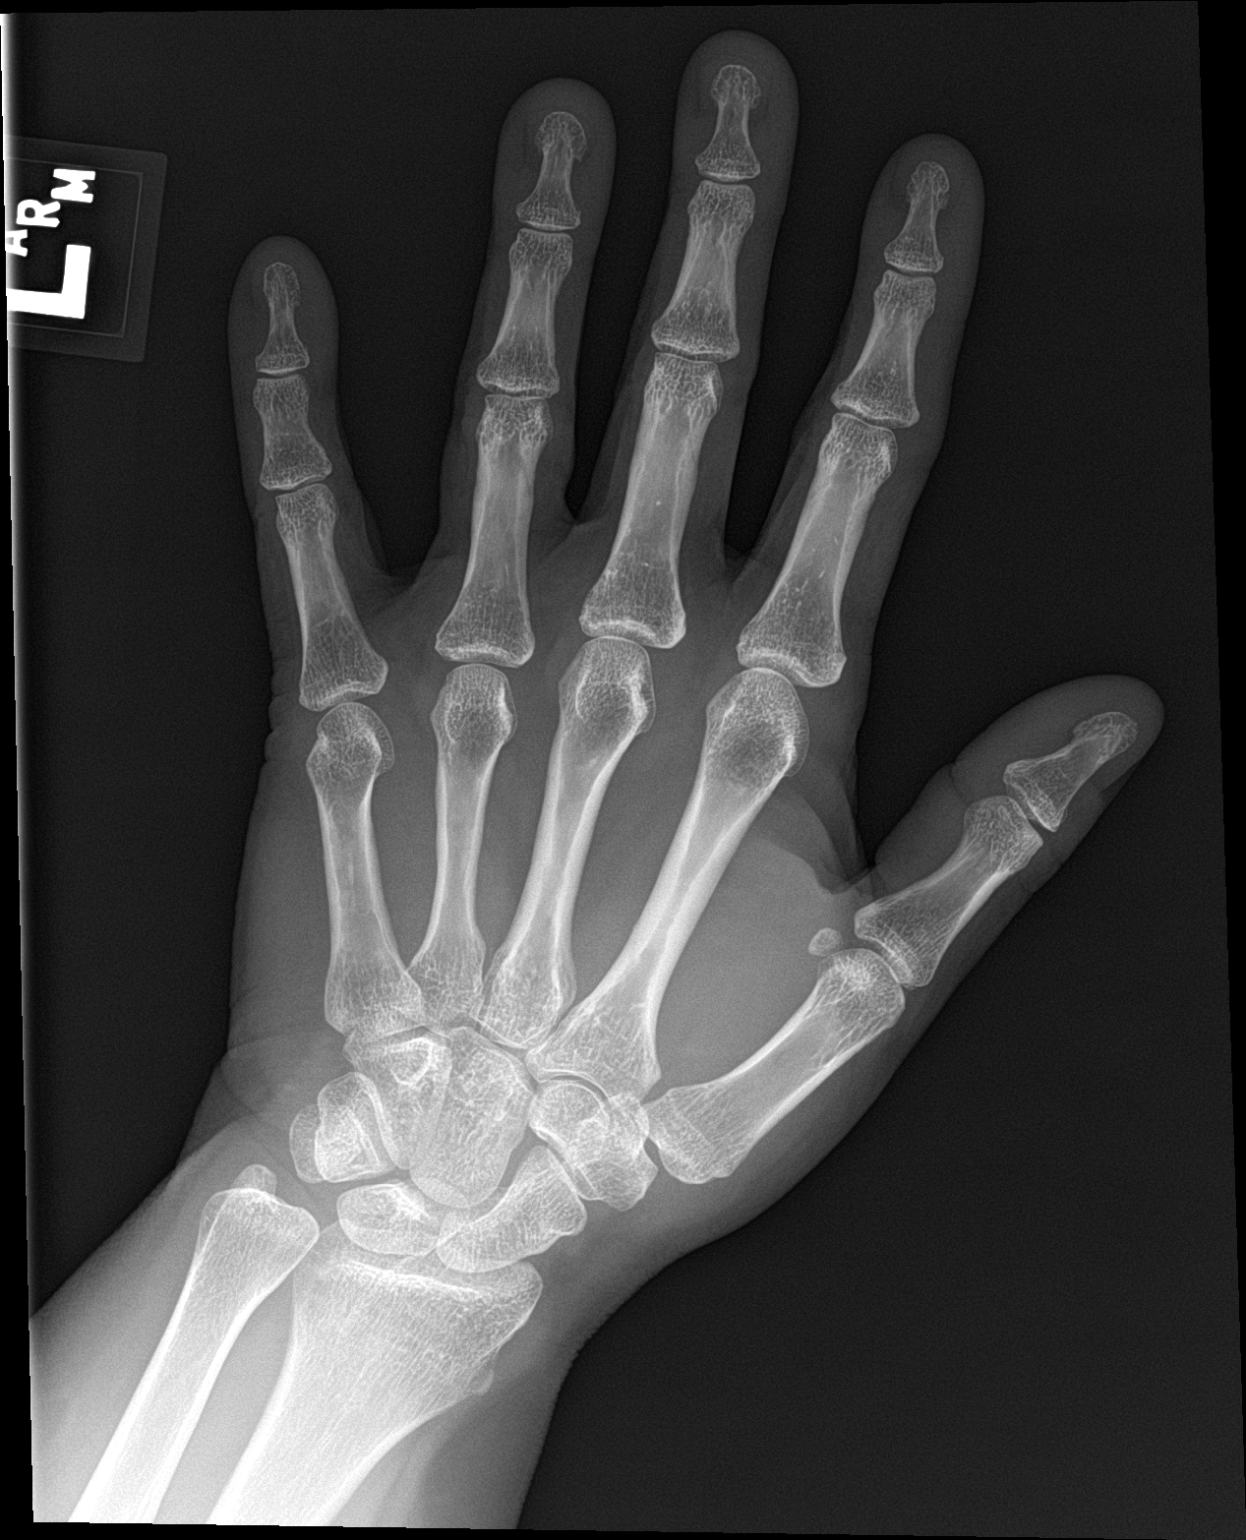

[hand obl]
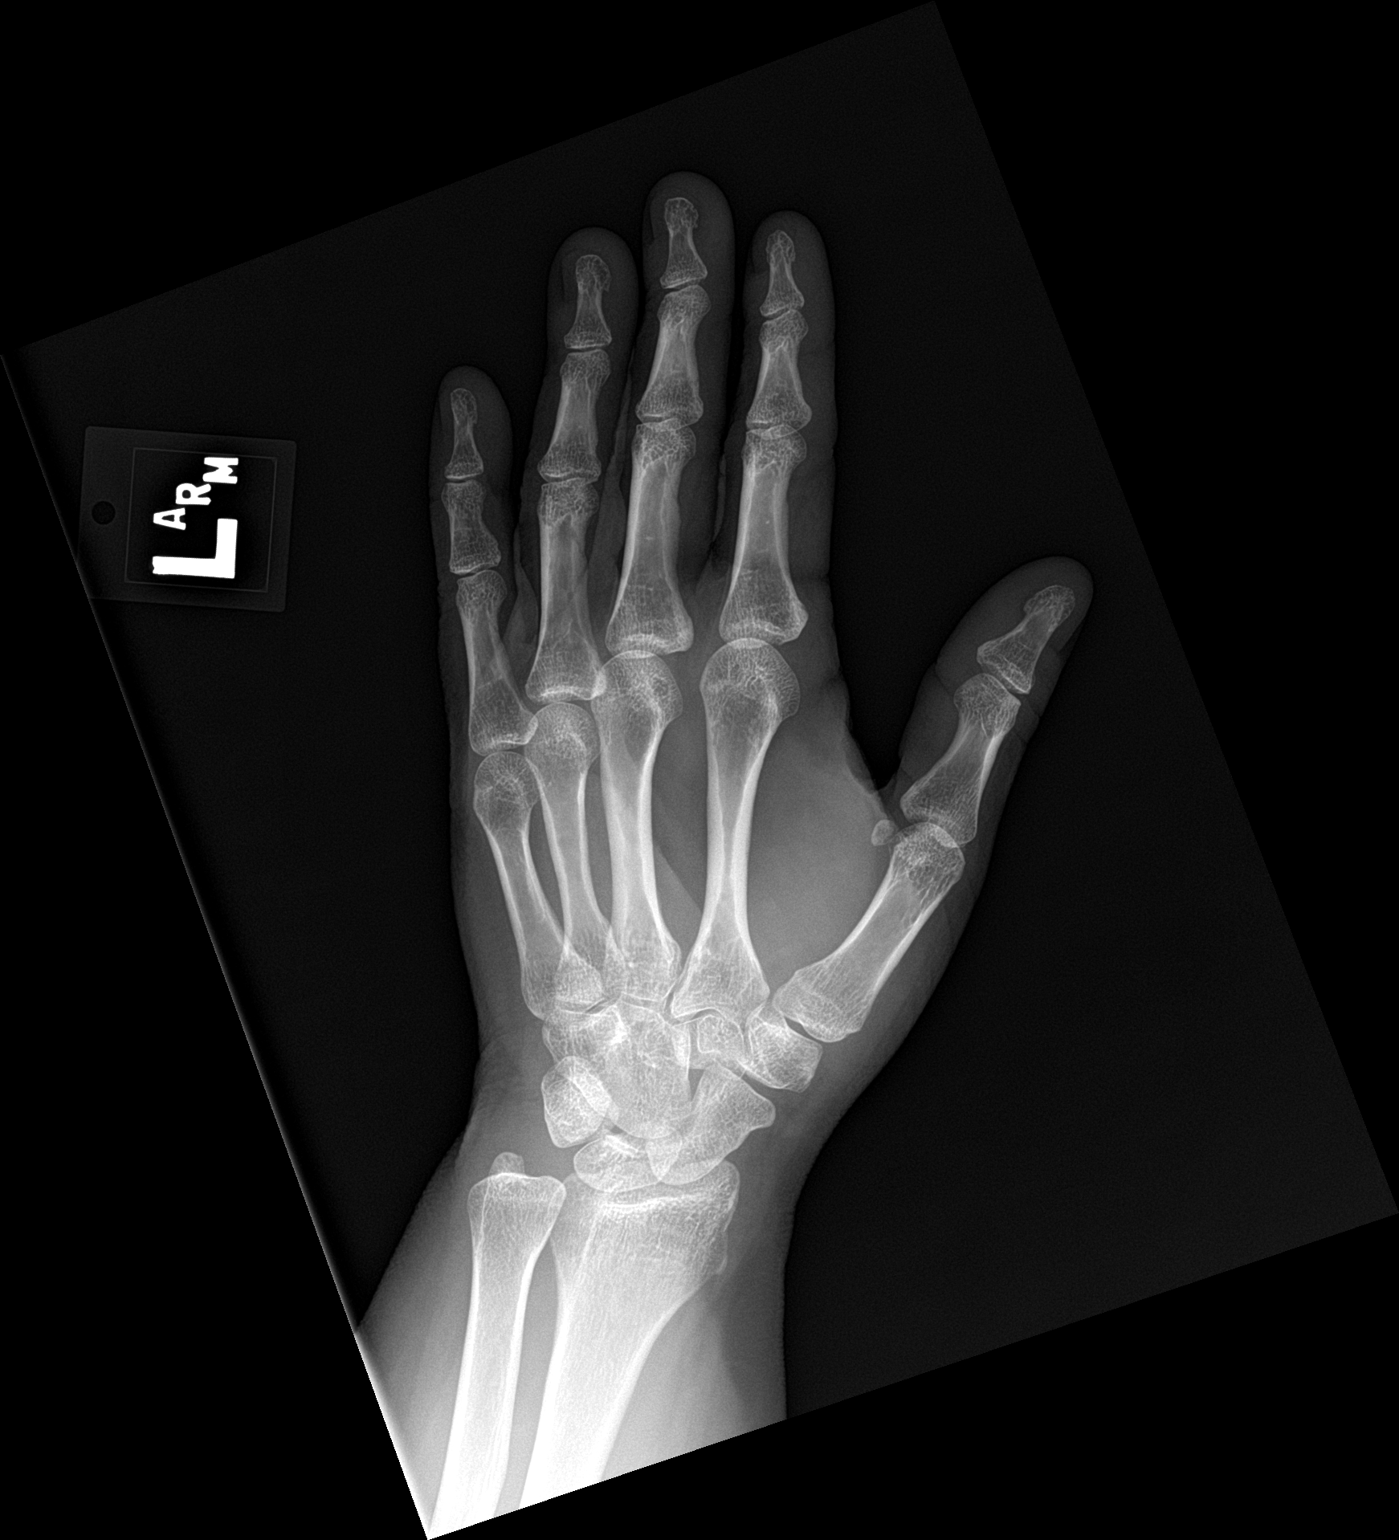

[hand lat]
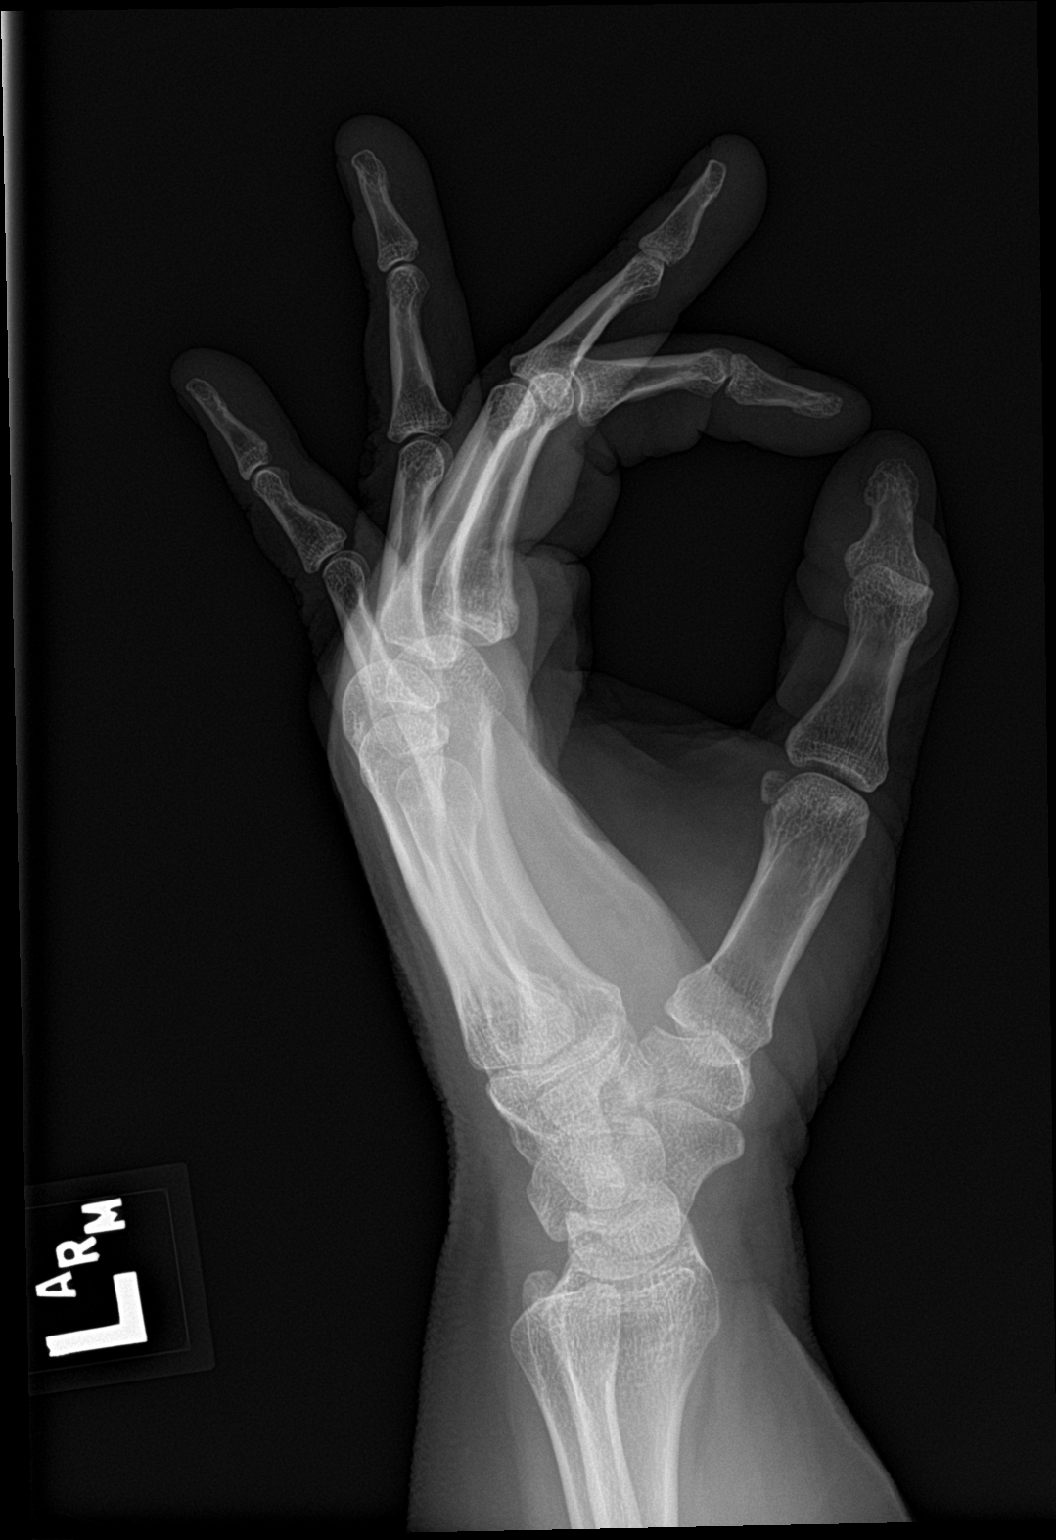

[3 of 3 positions shown; findings below may reference images not displayed]

FINDINGS: There is no evidence of fracture or dislocation. There is no
evidence of arthropathy or other focal bone abnormality. Soft
tissues are unremarkable.
IMPRESSION: Normal left hand.

## 2016-07-03 IMAGING — DX DG WRIST COMPLETE 3+V*L*
4 series · 4 of 4 positions shown · non-contrast
Comparison: None.

CLINICAL DATA: Fall down stairs with left wrist pain, initial
encounter

EXAM:
LEFT WRIST - COMPLETE 3+ VIEW

[wrist pa]
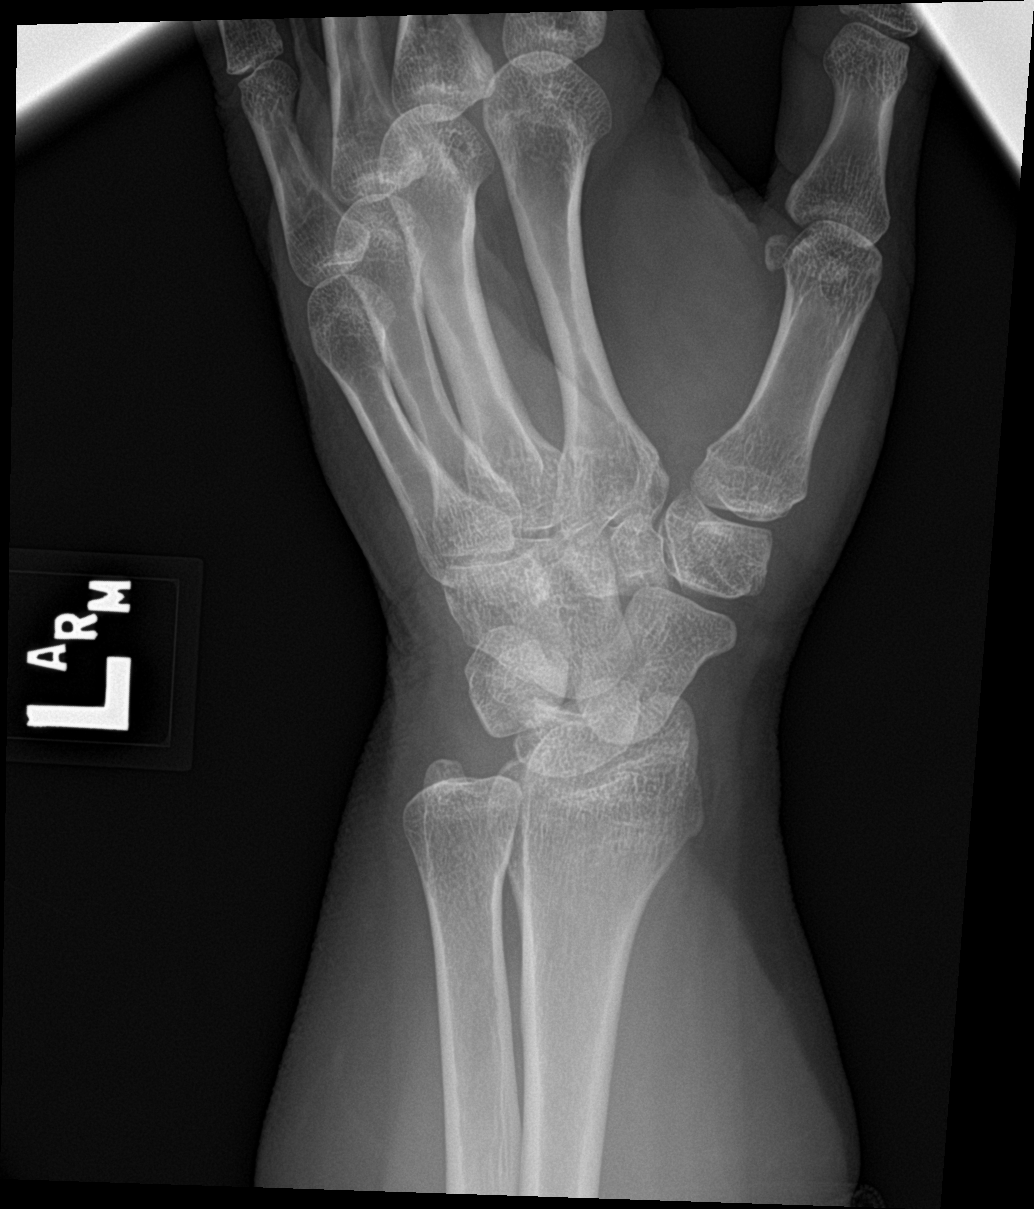

[wrist obl]
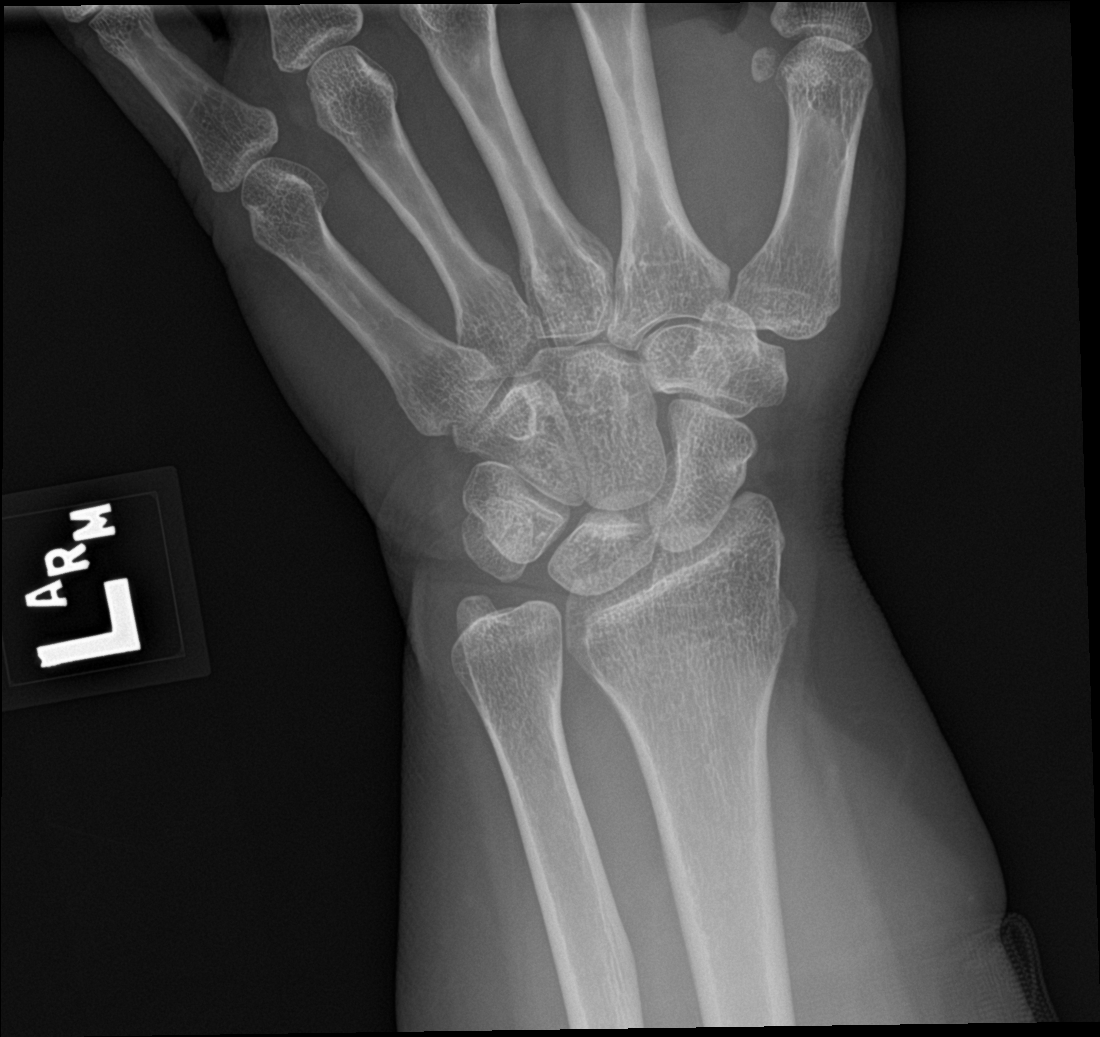

[wrist lat]
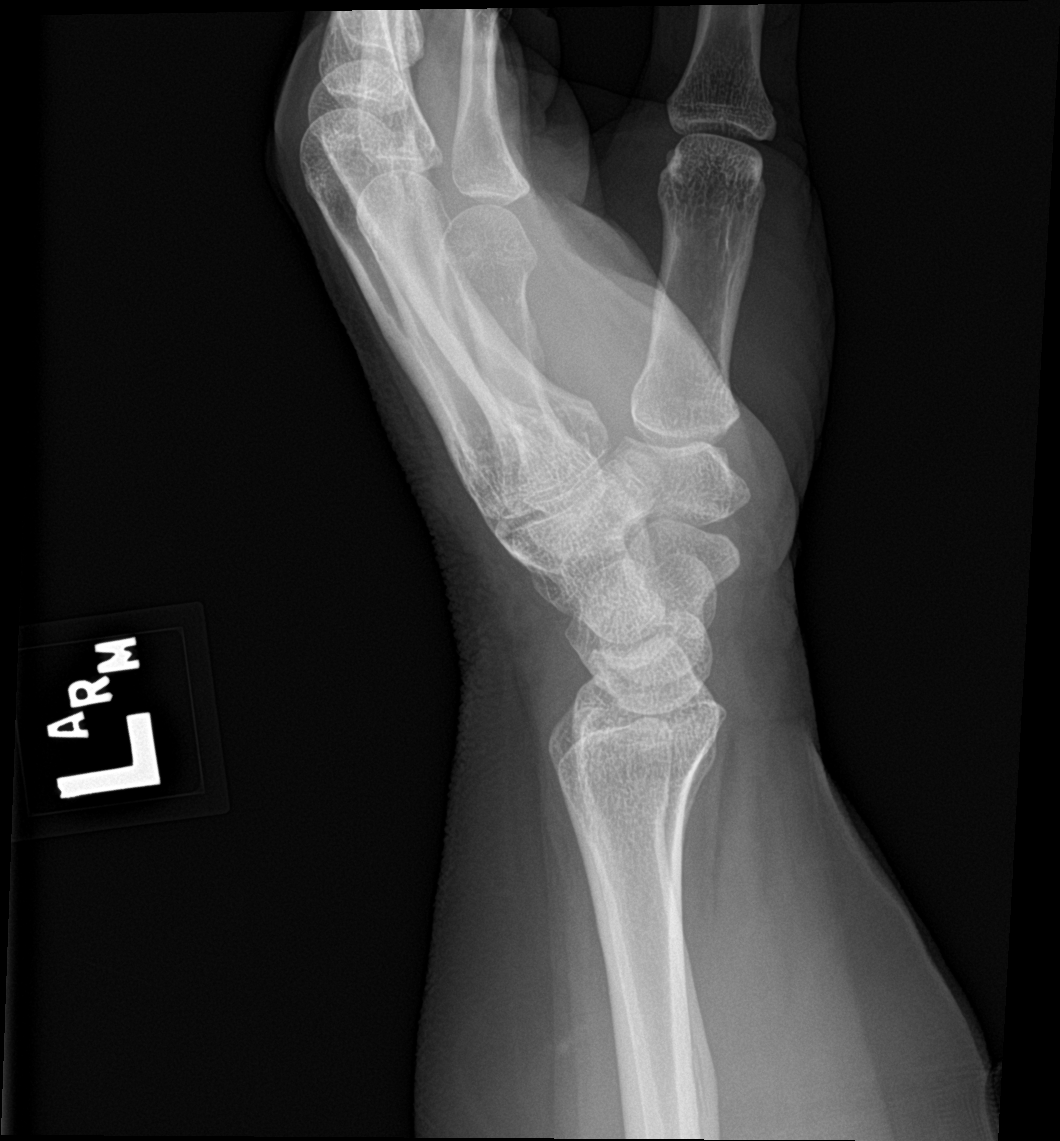

[wrist navicular]
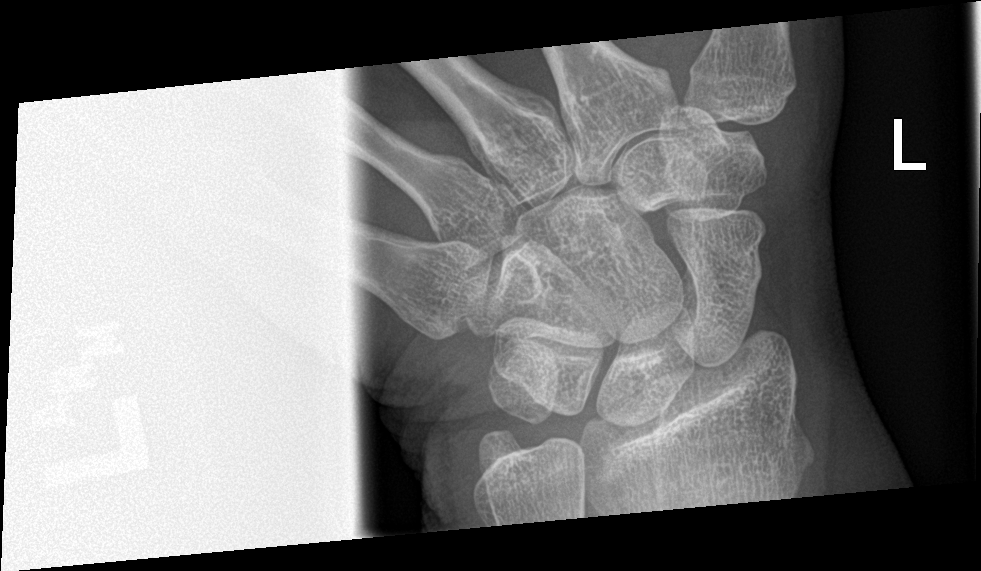

[4 of 4 positions shown; findings below may reference images not displayed]

FINDINGS: There is no evidence of fracture or dislocation. There is no
evidence of arthropathy or other focal bone abnormality. Soft
tissues are unremarkable.
IMPRESSION: No acute abnormality noted.
